# Patient Record
Sex: Male | Born: 1940 | Race: White | Hispanic: No | Marital: Married | Smoking: Former smoker
Health system: Southern US, Community
[De-identification: ages and names within clinical notes are randomized; demographics above are authoritative.]

## PROBLEM LIST (undated history)

## (undated) DIAGNOSIS — I10 Essential (primary) hypertension: Secondary | ICD-10-CM

## (undated) DIAGNOSIS — I4891 Unspecified atrial fibrillation: Secondary | ICD-10-CM

## (undated) DIAGNOSIS — M1990 Unspecified osteoarthritis, unspecified site: Secondary | ICD-10-CM

## (undated) DIAGNOSIS — Z8719 Personal history of other diseases of the digestive system: Secondary | ICD-10-CM

## (undated) DIAGNOSIS — K219 Gastro-esophageal reflux disease without esophagitis: Secondary | ICD-10-CM

## (undated) DIAGNOSIS — J449 Chronic obstructive pulmonary disease, unspecified: Secondary | ICD-10-CM

## (undated) DIAGNOSIS — G4730 Sleep apnea, unspecified: Secondary | ICD-10-CM

## (undated) DIAGNOSIS — K900 Celiac disease: Secondary | ICD-10-CM

## (undated) DIAGNOSIS — I350 Nonrheumatic aortic (valve) stenosis: Secondary | ICD-10-CM

## (undated) DIAGNOSIS — K2270 Barrett's esophagus without dysplasia: Secondary | ICD-10-CM

## (undated) DIAGNOSIS — I719 Aortic aneurysm of unspecified site, without rupture: Secondary | ICD-10-CM

## (undated) DIAGNOSIS — R011 Cardiac murmur, unspecified: Secondary | ICD-10-CM

## (undated) DIAGNOSIS — R112 Nausea with vomiting, unspecified: Secondary | ICD-10-CM

## (undated) DIAGNOSIS — Z9889 Other specified postprocedural states: Secondary | ICD-10-CM

## (undated) DIAGNOSIS — R42 Dizziness and giddiness: Secondary | ICD-10-CM

## (undated) HISTORY — PX: CARDIAC CATHETERIZATION: SHX172

## (undated) HISTORY — PX: TUMOR EXCISION: SHX421

## (undated) HISTORY — PX: COLONOSCOPY: SHX174

## (undated) HISTORY — PX: CARDIOVERSION: SHX1299

## (undated) HISTORY — PX: CARDIAC ELECTROPHYSIOLOGY STUDY AND ABLATION: SHX1294

---

## 2004-07-09 ENCOUNTER — Ambulatory Visit: Payer: Self-pay | Admitting: Gastroenterology

## 2005-11-15 ENCOUNTER — Ambulatory Visit: Payer: Self-pay

## 2006-09-03 ENCOUNTER — Ambulatory Visit: Payer: Self-pay | Admitting: Internal Medicine

## 2006-09-04 ENCOUNTER — Ambulatory Visit: Payer: Self-pay | Admitting: Internal Medicine

## 2006-11-12 ENCOUNTER — Ambulatory Visit: Payer: Self-pay | Admitting: Otolaryngology

## 2009-01-19 ENCOUNTER — Ambulatory Visit: Payer: Self-pay | Admitting: Internal Medicine

## 2009-02-13 ENCOUNTER — Ambulatory Visit: Payer: Self-pay | Admitting: Internal Medicine

## 2009-05-10 ENCOUNTER — Ambulatory Visit: Payer: Self-pay | Admitting: Internal Medicine

## 2011-05-08 ENCOUNTER — Ambulatory Visit: Payer: Self-pay | Admitting: Family Medicine

## 2011-12-05 DIAGNOSIS — R911 Solitary pulmonary nodule: Secondary | ICD-10-CM | POA: Insufficient documentation

## 2012-04-09 DIAGNOSIS — I480 Paroxysmal atrial fibrillation: Secondary | ICD-10-CM | POA: Insufficient documentation

## 2012-04-09 DIAGNOSIS — I10 Essential (primary) hypertension: Secondary | ICD-10-CM | POA: Insufficient documentation

## 2012-04-09 DIAGNOSIS — E785 Hyperlipidemia, unspecified: Secondary | ICD-10-CM | POA: Insufficient documentation

## 2012-11-23 DIAGNOSIS — M545 Low back pain: Secondary | ICD-10-CM | POA: Insufficient documentation

## 2012-11-23 DIAGNOSIS — M5136 Other intervertebral disc degeneration, lumbar region: Secondary | ICD-10-CM | POA: Insufficient documentation

## 2012-12-01 DIAGNOSIS — M47816 Spondylosis without myelopathy or radiculopathy, lumbar region: Secondary | ICD-10-CM | POA: Insufficient documentation

## 2012-12-28 DIAGNOSIS — I70219 Atherosclerosis of native arteries of extremities with intermittent claudication, unspecified extremity: Secondary | ICD-10-CM | POA: Insufficient documentation

## 2013-02-02 DIAGNOSIS — M7072 Other bursitis of hip, left hip: Secondary | ICD-10-CM | POA: Insufficient documentation

## 2013-09-22 DIAGNOSIS — I350 Nonrheumatic aortic (valve) stenosis: Secondary | ICD-10-CM | POA: Insufficient documentation

## 2014-01-26 ENCOUNTER — Emergency Department: Payer: Self-pay | Admitting: Emergency Medicine

## 2014-01-26 LAB — BASIC METABOLIC PANEL
Anion Gap: 3 — ABNORMAL LOW (ref 7–16)
BUN: 16 mg/dL (ref 7–18)
CALCIUM: 8.5 mg/dL (ref 8.5–10.1)
Chloride: 107 mmol/L (ref 98–107)
Co2: 29 mmol/L (ref 21–32)
Creatinine: 0.98 mg/dL (ref 0.60–1.30)
EGFR (African American): >60
GLUCOSE: 110 mg/dL — AB (ref 65–99)
Osmolality: 279 (ref 275–301)
Potassium: 4 mmol/L (ref 3.5–5.1)
Sodium: 139 mmol/L (ref 136–145)

## 2014-01-26 LAB — CBC
HCT: 38.1 % — AB (ref 40.0–52.0)
HGB: 12.9 g/dL — AB (ref 13.0–18.0)
MCH: 28.9 pg (ref 26.0–34.0)
MCHC: 33.8 g/dL (ref 32.0–36.0)
MCV: 85 fL (ref 80–100)
PLATELETS: 173 10*3/uL (ref 150–440)
RBC: 4.47 10*6/uL (ref 4.40–5.90)
RDW: 14.1 % (ref 11.5–14.5)
WBC: 7.2 10*3/uL (ref 3.8–10.6)

## 2014-01-26 LAB — TROPONIN I
Troponin-I: <0.02 ng/mL
Troponin-I: <0.02 ng/mL

## 2014-01-26 LAB — APTT: ACTIVATED PTT: 28.6 s (ref 23.6–35.9)

## 2014-01-26 LAB — PRO B NATRIURETIC PEPTIDE: B-TYPE NATIURETIC PEPTID: 158 pg/mL — AB (ref 0–125)

## 2014-01-26 LAB — CK TOTAL AND CKMB (NOT AT ARMC)
CK, Total: 181 U/L
CK-MB: 2.3 ng/mL (ref 0.5–3.6)

## 2014-01-26 LAB — PROTIME-INR
INR: 1.1
PROTHROMBIN TIME: 14.2 s (ref 11.5–14.7)

## 2014-09-14 DIAGNOSIS — M5117 Intervertebral disc disorders with radiculopathy, lumbosacral region: Secondary | ICD-10-CM | POA: Insufficient documentation

## 2014-11-10 DIAGNOSIS — M4807 Spinal stenosis, lumbosacral region: Secondary | ICD-10-CM | POA: Insufficient documentation

## 2014-12-09 DIAGNOSIS — J439 Emphysema, unspecified: Secondary | ICD-10-CM | POA: Insufficient documentation

## 2014-12-22 DIAGNOSIS — M25552 Pain in left hip: Secondary | ICD-10-CM | POA: Insufficient documentation

## 2014-12-22 DIAGNOSIS — M47816 Spondylosis without myelopathy or radiculopathy, lumbar region: Secondary | ICD-10-CM | POA: Insufficient documentation

## 2015-02-25 DIAGNOSIS — M67952 Unspecified disorder of synovium and tendon, left thigh: Secondary | ICD-10-CM | POA: Insufficient documentation

## 2015-04-06 DIAGNOSIS — M2170 Unequal limb length (acquired), unspecified site: Secondary | ICD-10-CM | POA: Insufficient documentation

## 2016-03-05 ENCOUNTER — Encounter: Payer: Self-pay | Admitting: *Deleted

## 2016-03-11 NOTE — Discharge Instructions (Signed)

## 2016-03-13 ENCOUNTER — Ambulatory Visit: Payer: Medicare Other | Admitting: Anesthesiology

## 2016-03-13 ENCOUNTER — Encounter
Admission: RE | Disposition: A | Discharge status: Home / Self Care | Payer: Self-pay | Source: Ambulatory Visit | Attending: Ophthalmology

## 2016-03-13 ENCOUNTER — Ambulatory Visit
Admission: RE | Admit: 2016-03-13 | Discharge: 2016-03-13 | Disposition: A | Discharge status: Home / Self Care | Payer: Medicare Other | Source: Ambulatory Visit | Attending: Ophthalmology | Admitting: Ophthalmology

## 2016-03-13 DIAGNOSIS — M1990 Unspecified osteoarthritis, unspecified site: Secondary | ICD-10-CM | POA: Diagnosis not present

## 2016-03-13 DIAGNOSIS — G4730 Sleep apnea, unspecified: Secondary | ICD-10-CM | POA: Insufficient documentation

## 2016-03-13 DIAGNOSIS — H2511 Age-related nuclear cataract, right eye: Secondary | ICD-10-CM | POA: Diagnosis not present

## 2016-03-13 DIAGNOSIS — Z87891 Personal history of nicotine dependence: Secondary | ICD-10-CM | POA: Diagnosis not present

## 2016-03-13 DIAGNOSIS — I10 Essential (primary) hypertension: Secondary | ICD-10-CM | POA: Insufficient documentation

## 2016-03-13 DIAGNOSIS — J449 Chronic obstructive pulmonary disease, unspecified: Secondary | ICD-10-CM | POA: Diagnosis not present

## 2016-03-13 DIAGNOSIS — K219 Gastro-esophageal reflux disease without esophagitis: Secondary | ICD-10-CM | POA: Diagnosis not present

## 2016-03-13 DIAGNOSIS — I739 Peripheral vascular disease, unspecified: Secondary | ICD-10-CM | POA: Diagnosis not present

## 2016-03-13 DIAGNOSIS — K449 Diaphragmatic hernia without obstruction or gangrene: Secondary | ICD-10-CM | POA: Diagnosis not present

## 2016-03-13 HISTORY — DX: Unspecified atrial fibrillation: I48.91

## 2016-03-13 HISTORY — DX: Unspecified osteoarthritis, unspecified site: M19.90

## 2016-03-13 HISTORY — DX: Other specified postprocedural states: Z98.890

## 2016-03-13 HISTORY — DX: Nausea with vomiting, unspecified: R11.2

## 2016-03-13 HISTORY — PX: CATARACT EXTRACTION W/PHACO: SHX586

## 2016-03-13 HISTORY — DX: Gastro-esophageal reflux disease without esophagitis: K21.9

## 2016-03-13 HISTORY — DX: Personal history of other diseases of the digestive system: Z87.19

## 2016-03-13 HISTORY — DX: Chronic obstructive pulmonary disease, unspecified: J44.9

## 2016-03-13 HISTORY — DX: Sleep apnea, unspecified: G47.30

## 2016-03-13 HISTORY — DX: Celiac disease: K90.0

## 2016-03-13 HISTORY — DX: Essential (primary) hypertension: I10

## 2016-03-13 HISTORY — DX: Cardiac murmur, unspecified: R01.1

## 2016-03-13 HISTORY — DX: Barrett's esophagus without dysplasia: K22.70

## 2016-03-13 HISTORY — DX: Nonrheumatic aortic (valve) stenosis: I35.0

## 2016-03-13 HISTORY — DX: Aortic aneurysm of unspecified site, without rupture: I71.9

## 2016-03-13 HISTORY — DX: Dizziness and giddiness: R42

## 2016-03-13 SURGERY — PHACOEMULSIFICATION, CATARACT, WITH IOL INSERTION
Anesthesia: Monitor Anesthesia Care | Laterality: Right | Wound class: Clean

## 2016-03-13 MED ORDER — TETRACAINE HCL 0.5 % OP SOLN
1.0000 [drp] | OPHTHALMIC | Status: DC | PRN
  Administered 2016-03-13: 1 [drp] via OPHTHALMIC

## 2016-03-13 MED ORDER — BRIMONIDINE TARTRATE 0.2 % OP SOLN
OPHTHALMIC | Status: DC | PRN
  Administered 2016-03-13: 1 [drp] via OPHTHALMIC

## 2016-03-13 MED ORDER — EPINEPHRINE HCL 1 MG/ML IJ SOLN
INTRAOCULAR | Status: DC | PRN
  Administered 2016-03-13: 74 mL via OPHTHALMIC

## 2016-03-13 MED ORDER — FENTANYL CITRATE (PF) 100 MCG/2ML IJ SOLN
INTRAMUSCULAR | Status: DC | PRN
  Administered 2016-03-13: 50 ug via INTRAVENOUS

## 2016-03-13 MED ORDER — TIMOLOL MALEATE 0.5 % OP SOLN
OPHTHALMIC | Status: DC | PRN
  Administered 2016-03-13: 1 [drp] via OPHTHALMIC

## 2016-03-13 MED ORDER — POVIDONE-IODINE 5 % OP SOLN
1.0000 "application " | OPHTHALMIC | Status: DC | PRN
  Administered 2016-03-13: 1 via OPHTHALMIC

## 2016-03-13 MED ORDER — NA HYALUR & NA CHOND-NA HYALUR 0.4-0.35 ML IO KIT
PACK | INTRAOCULAR | Status: DC | PRN
  Administered 2016-03-13: 1 mL via INTRAOCULAR

## 2016-03-13 MED ORDER — CEFUROXIME OPHTHALMIC INJECTION 1 MG/0.1 ML
INJECTION | OPHTHALMIC | Status: DC | PRN
  Administered 2016-03-13: 0.1 mL via OPHTHALMIC

## 2016-03-13 MED ORDER — ARMC OPHTHALMIC DILATING GEL
1.0000 "application " | OPHTHALMIC | Status: DC | PRN
  Administered 2016-03-13 (×2): 1 via OPHTHALMIC

## 2016-03-13 MED ORDER — MIDAZOLAM HCL 2 MG/2ML IJ SOLN
INTRAMUSCULAR | Status: DC | PRN
  Administered 2016-03-13: 2 mg via INTRAVENOUS

## 2016-03-13 MED ORDER — LIDOCAINE HCL (PF) 4 % IJ SOLN
INTRAMUSCULAR | Status: DC | PRN
  Administered 2016-03-13: 1 mL via OPHTHALMIC

## 2016-03-13 SURGICAL SUPPLY — 25 items
CANNULA ANT/CHMB 27GA (MISCELLANEOUS) ×3 IMPLANT
CARTRIDGE ABBOTT (MISCELLANEOUS) IMPLANT
GLOVE SURG LX 7.5 STRW (GLOVE) ×2
GLOVE SURG LX STRL 7.5 STRW (GLOVE) ×1 IMPLANT
GLOVE SURG TRIUMPH 8.0 PF LTX (GLOVE) ×3 IMPLANT
GOWN STRL REUS W/ TWL LRG LVL3 (GOWN DISPOSABLE) ×2 IMPLANT
GOWN STRL REUS W/TWL LRG LVL3 (GOWN DISPOSABLE) ×4
LENS IOL TECNIS ITEC 20.5 (Intraocular Lens) ×3 IMPLANT
MARKER SKIN DUAL TIP RULER LAB (MISCELLANEOUS) ×3 IMPLANT
NDL RETROBULBAR .5 NSTRL (NEEDLE) IMPLANT
NEEDLE FILTER BLUNT 18X 1/2SAF (NEEDLE) ×2
NEEDLE FILTER BLUNT 18X1 1/2 (NEEDLE) ×1 IMPLANT
PACK CATARACT BRASINGTON (MISCELLANEOUS) ×3 IMPLANT
PACK EYE AFTER SURG (MISCELLANEOUS) ×3 IMPLANT
PACK OPTHALMIC (MISCELLANEOUS) ×3 IMPLANT
RING MALYGIN 7.0 (MISCELLANEOUS) IMPLANT
SUT ETHILON 10-0 CS-B-6CS-B-6 (SUTURE)
SUT VICRYL  9 0 (SUTURE)
SUT VICRYL 9 0 (SUTURE) IMPLANT
SUTURE EHLN 10-0 CS-B-6CS-B-6 (SUTURE) IMPLANT
SYR 3ML LL SCALE MARK (SYRINGE) ×3 IMPLANT
SYR 5ML LL (SYRINGE) ×3 IMPLANT
SYR TB 1ML LUER SLIP (SYRINGE) ×3 IMPLANT
WATER STERILE IRR 250ML POUR (IV SOLUTION) ×3 IMPLANT
WIPE NON LINTING 3.25X3.25 (MISCELLANEOUS) ×3 IMPLANT

## 2016-03-13 NOTE — Anesthesia Preprocedure Evaluation (Addendum)
Anesthesia Evaluation  Patient identified by MRN, date of birth, ID band  Reviewed: Allergy & Precautions, NPO status , Patient's Chart, lab work & pertinent test results  History of Anesthesia Complications (+) PONV and history of anesthetic complications  Airway Mallampati: II  TM Distance: >3 FB Neck ROM: Full    Dental no notable dental hx.    Pulmonary sleep apnea , COPD, former smoker,    Pulmonary exam normal        Cardiovascular hypertension, Pt. on medications and Pt. on home beta blockers + Peripheral Vascular Disease  Normal cardiovascular exam+ Valvular Problems/Murmurs AS      Neuro/Psych negative neurological ROS  negative psych ROS   GI/Hepatic Neg liver ROS, hiatal hernia, GERD  Medicated and Controlled,  Endo/Other  negative endocrine ROS  Renal/GU negative Renal ROS     Musculoskeletal  (+) Arthritis , Osteoarthritis,    Abdominal   Peds  Hematology   Anesthesia Other Findings   Reproductive/Obstetrics                             Anesthesia Physical  Anesthesia Plan  ASA: III  Anesthesia Plan: MAC   Post-op Pain Management:    Induction: Intravenous  Airway Management Planned:   Additional Equipment:   Intra-op Plan:   Post-operative Plan:   Informed Consent: I have reviewed the patients History and Physical, chart, labs and discussed the procedure including the risks, benefits and alternatives for the proposed anesthesia with the patient or authorized representative who has indicated his/her understanding and acceptance.     Plan Discussed with: CRNA  Anesthesia Plan Comments:         Anesthesia Quick Evaluation  

## 2016-03-13 NOTE — Op Note (Signed)
LOCATION:  [facility]   PREOPERATIVE DIAGNOSIS:    Nuclear sclerotic cataract right eye. H25.11   POSTOPERATIVE DIAGNOSIS:  Nuclear sclerotic cataract right eye.     PROCEDURE:  Phacoemusification with posterior chamber intraocular lens placement of the right eye   LENS:   Implant Name Type Inv. Item Serial No. Manufacturer Lot No. LRB No. Used  LENS IOL DIOP 20.5 - [id number] Intraocular Lens LENS IOL DIOP 20.5 [id number]   Right 1        ULTRASOUND TIME: 10 % of 1 minutes, 17 seconds.  CDE 7.9   SURGEON:  Deirdre Evenerhadwick R. Lockie Bothun, MD   ANESTHESIA:  Topical with tetracaine drops and 2% Xylocaine jelly, augmented with 1% preservative-free intracameral lidocaine.    COMPLICATIONS:  None.   DESCRIPTION OF PROCEDURE:  The patient was identified in the holding room and transported to the operating room and placed in the supine position under the operating microscope.  The right eye was identified as the operative eye and it was prepped and draped in the usual sterile ophthalmic fashion.   A 1 millimeter clear-corneal paracentesis was made at the 12:00 position.  0.5 ml of preservative-free 1% lidocaine was injected into the anterior chamber. The anterior chamber was filled with Viscoat viscoelastic.  A 2.4 millimeter keratome was used to make a near-clear corneal incision at the 9:00 position.  A curvilinear capsulorrhexis was made with a cystotome and capsulorrhexis forceps.  Balanced salt solution was used to hydrodissect and hydrodelineate the nucleus.   Phacoemulsification was then used in stop and chop fashion to remove the lens nucleus and epinucleus.  The remaining cortex was then removed using the irrigation and aspiration handpiece. Provisc was then placed into the capsular bag to distend it for lens placement.  A lens was then injected into the capsular bag.  The remaining viscoelastic was aspirated.   Wounds were hydrated with balanced salt solution.  The anterior  chamber was inflated to a physiologic pressure with balanced salt solution.  No wound leaks were noted. Cefuroxime 0.1 ml of a 10mg /ml solution was injected into the anterior chamber for a dose of 1 mg of intracameral antibiotic at the completion of the case.   Timolol and Brimonidine drops were applied to the eye.  The patient was taken to the recovery room in stable condition without complications of anesthesia or surgery.   Sharisse Rantz 03/13/2016, 10:45 AM

## 2016-03-13 NOTE — H&P (Signed)
  The History and Physical notes are on paper, have been signed, and are to be scanned. The patient remains stable and unchanged from the H&P.   Previous H&P reviewed, patient examined, and there are no changes.  Jeremy Mason 03/13/2016 9:26 AM

## 2016-03-13 NOTE — Anesthesia Postprocedure Evaluation (Signed)
Anesthesia Post Note  Patient: Jeremy Mason  Procedure(s) Performed: Procedure(s) (LRB): CATARACT EXTRACTION PHACO AND INTRAOCULAR LENS PLACEMENT (IOC) (Right)  Patient location during evaluation: PACU Anesthesia Type: MAC Level of consciousness: awake and alert and oriented Pain management: pain level controlled Vital Signs Assessment: post-procedure vital signs reviewed and stable Respiratory status: spontaneous breathing and nonlabored ventilation Cardiovascular status: stable Postop Assessment: no signs of nausea or vomiting and adequate PO intake Anesthetic complications: no    Harolyn RutherfordJoshua Vidya Bamford

## 2016-03-13 NOTE — Anesthesia Procedure Notes (Signed)
Procedure Name: MAC Performed by: Jeremy Mason Pre-anesthesia Checklist: Patient identified, Emergency Drugs available, Suction available, Timeout performed and Patient being monitored Patient Re-evaluated:Patient Re-evaluated prior to inductionOxygen Delivery Method: Nasal cannula Placement Confirmation: positive ETCO2       

## 2016-03-13 NOTE — Transfer of Care (Signed)
Immediate Anesthesia Transfer of Care Note  Patient: Jeremy Mason  Procedure(s) Performed: Procedure(s) with comments: CATARACT EXTRACTION PHACO AND INTRAOCULAR LENS PLACEMENT (IOC) (Right) - sleep apnea - no CPAP  Patient Location: PACU  Anesthesia Type: MAC  Level of Consciousness: awake, alert  and patient cooperative  Airway and Oxygen Therapy: Patient Spontanous Breathing and Patient connected to supplemental oxygen  Post-op Assessment: Post-op Vital signs reviewed, Patient's Cardiovascular Status Stable, Respiratory Function Stable, Patent Airway and No signs of Nausea or vomiting  Post-op Vital Signs: Reviewed and stable  Complications: No apparent anesthesia complications

## 2016-03-14 ENCOUNTER — Encounter: Payer: Self-pay | Admitting: Ophthalmology

## 2016-04-04 ENCOUNTER — Encounter: Payer: Self-pay | Admitting: *Deleted

## 2016-04-05 NOTE — Discharge Instructions (Signed)

## 2016-04-10 ENCOUNTER — Ambulatory Visit
Admission: RE | Admit: 2016-04-10 | Discharge: 2016-04-10 | Disposition: A | Discharge status: Home / Self Care | Payer: Medicare Other | Source: Ambulatory Visit | Attending: Ophthalmology | Admitting: Ophthalmology

## 2016-04-10 ENCOUNTER — Encounter
Admission: RE | Disposition: A | Discharge status: Home / Self Care | Payer: Self-pay | Source: Ambulatory Visit | Attending: Ophthalmology

## 2016-04-10 ENCOUNTER — Ambulatory Visit: Payer: Medicare Other | Admitting: Student in an Organized Health Care Education/Training Program

## 2016-04-10 DIAGNOSIS — K219 Gastro-esophageal reflux disease without esophagitis: Secondary | ICD-10-CM | POA: Diagnosis not present

## 2016-04-10 DIAGNOSIS — H2512 Age-related nuclear cataract, left eye: Secondary | ICD-10-CM | POA: Diagnosis not present

## 2016-04-10 DIAGNOSIS — I739 Peripheral vascular disease, unspecified: Secondary | ICD-10-CM | POA: Diagnosis not present

## 2016-04-10 DIAGNOSIS — K449 Diaphragmatic hernia without obstruction or gangrene: Secondary | ICD-10-CM | POA: Diagnosis not present

## 2016-04-10 DIAGNOSIS — J449 Chronic obstructive pulmonary disease, unspecified: Secondary | ICD-10-CM | POA: Diagnosis not present

## 2016-04-10 DIAGNOSIS — Z87891 Personal history of nicotine dependence: Secondary | ICD-10-CM | POA: Insufficient documentation

## 2016-04-10 DIAGNOSIS — M1990 Unspecified osteoarthritis, unspecified site: Secondary | ICD-10-CM | POA: Insufficient documentation

## 2016-04-10 DIAGNOSIS — G4730 Sleep apnea, unspecified: Secondary | ICD-10-CM | POA: Insufficient documentation

## 2016-04-10 DIAGNOSIS — I10 Essential (primary) hypertension: Secondary | ICD-10-CM | POA: Diagnosis not present

## 2016-04-10 HISTORY — PX: CATARACT EXTRACTION W/PHACO: SHX586

## 2016-04-10 SURGERY — PHACOEMULSIFICATION, CATARACT, WITH IOL INSERTION
Anesthesia: Monitor Anesthesia Care | Site: Eye | Laterality: Left | Wound class: Clean

## 2016-04-10 MED ORDER — TIMOLOL MALEATE 0.5 % OP SOLN
OPHTHALMIC | Status: DC | PRN
  Administered 2016-04-10: 1 [drp] via OPHTHALMIC

## 2016-04-10 MED ORDER — ARMC OPHTHALMIC DILATING GEL
1.0000 "application " | OPHTHALMIC | Status: DC | PRN
  Administered 2016-04-10 (×2): 1 via OPHTHALMIC

## 2016-04-10 MED ORDER — MIDAZOLAM HCL 2 MG/2ML IJ SOLN
INTRAMUSCULAR | Status: DC | PRN
  Administered 2016-04-10: 2 mg via INTRAVENOUS

## 2016-04-10 MED ORDER — LACTATED RINGERS IV SOLN: 500.0000 mL | INTRAVENOUS | Status: DC

## 2016-04-10 MED ORDER — ONDANSETRON HCL 4 MG/2ML IJ SOLN
INTRAMUSCULAR | Status: DC | PRN
  Administered 2016-04-10: 4 mg via INTRAVENOUS

## 2016-04-10 MED ORDER — TETRACAINE HCL 0.5 % OP SOLN
1.0000 [drp] | OPHTHALMIC | Status: DC | PRN
  Administered 2016-04-10: 1 [drp] via OPHTHALMIC

## 2016-04-10 MED ORDER — CEFUROXIME OPHTHALMIC INJECTION 1 MG/0.1 ML
INJECTION | OPHTHALMIC | Status: DC | PRN
  Administered 2016-04-10: 0.1 mL via INTRACAMERAL

## 2016-04-10 MED ORDER — EPINEPHRINE HCL 1 MG/ML IJ SOLN
INTRAOCULAR | Status: DC | PRN
  Administered 2016-04-10: 65 mL via OPHTHALMIC

## 2016-04-10 MED ORDER — BRIMONIDINE TARTRATE 0.2 % OP SOLN
OPHTHALMIC | Status: DC | PRN
  Administered 2016-04-10: 1 [drp] via OPHTHALMIC

## 2016-04-10 MED ORDER — NA HYALUR & NA CHOND-NA HYALUR 0.4-0.35 ML IO KIT
PACK | INTRAOCULAR | Status: DC | PRN
  Administered 2016-04-10: 1 mL via INTRAOCULAR

## 2016-04-10 MED ORDER — LIDOCAINE HCL (PF) 4 % IJ SOLN
INTRAOCULAR | Status: DC | PRN
  Administered 2016-04-10: 1 mL via OPHTHALMIC

## 2016-04-10 MED ORDER — ACETAMINOPHEN 325 MG PO TABS: 325.0000 mg | ORAL_TABLET | ORAL | Status: DC | PRN

## 2016-04-10 MED ORDER — ACETAMINOPHEN 160 MG/5ML PO SOLN: 325.0000 mg | ORAL | Status: DC | PRN

## 2016-04-10 MED ORDER — LACTATED RINGERS IV SOLN: INTRAVENOUS | Status: DC

## 2016-04-10 MED ORDER — POVIDONE-IODINE 5 % OP SOLN
1.0000 "application " | OPHTHALMIC | Status: DC | PRN
  Administered 2016-04-10: 1 via OPHTHALMIC

## 2016-04-10 SURGICAL SUPPLY — 25 items
CANNULA ANT/CHMB 27GA (MISCELLANEOUS) IMPLANT
CARTRIDGE ABBOTT (MISCELLANEOUS) IMPLANT
GLOVE SURG LX 7.5 STRW (GLOVE) ×2
GLOVE SURG LX STRL 7.5 STRW (GLOVE) ×1 IMPLANT
GLOVE SURG TRIUMPH 8.0 PF LTX (GLOVE) ×3 IMPLANT
GOWN STRL REUS W/ TWL LRG LVL3 (GOWN DISPOSABLE) ×2 IMPLANT
GOWN STRL REUS W/TWL LRG LVL3 (GOWN DISPOSABLE) ×4
LENS IOL TECNIS ITEC 21.0 (Intraocular Lens) ×3 IMPLANT
MARKER SKIN DUAL TIP RULER LAB (MISCELLANEOUS) ×3 IMPLANT
NDL RETROBULBAR .5 NSTRL (NEEDLE) IMPLANT
NEEDLE FILTER BLUNT 18X 1/2SAF (NEEDLE) ×2
NEEDLE FILTER BLUNT 18X1 1/2 (NEEDLE) ×1 IMPLANT
PACK CATARACT BRASINGTON (MISCELLANEOUS) ×3 IMPLANT
PACK EYE AFTER SURG (MISCELLANEOUS) ×3 IMPLANT
PACK OPTHALMIC (MISCELLANEOUS) ×3 IMPLANT
RING MALYGIN 7.0 (MISCELLANEOUS) IMPLANT
SUT ETHILON 10-0 CS-B-6CS-B-6 (SUTURE)
SUT VICRYL  9 0 (SUTURE)
SUT VICRYL 9 0 (SUTURE) IMPLANT
SUTURE EHLN 10-0 CS-B-6CS-B-6 (SUTURE) IMPLANT
SYR 3ML LL SCALE MARK (SYRINGE) ×3 IMPLANT
SYR 5ML LL (SYRINGE) ×3 IMPLANT
SYR TB 1ML LUER SLIP (SYRINGE) ×3 IMPLANT
WATER STERILE IRR 250ML POUR (IV SOLUTION) ×3 IMPLANT
WIPE NON LINTING 3.25X3.25 (MISCELLANEOUS) ×3 IMPLANT

## 2016-04-10 NOTE — Anesthesia Procedure Notes (Signed)
Procedure Name: MAC Performed by: Messiyah Waterson Pre-anesthesia Checklist: Patient identified, Emergency Drugs available, Suction available, Timeout performed and Patient being monitored Patient Re-evaluated:Patient Re-evaluated prior to inductionOxygen Delivery Method: Nasal cannula Placement Confirmation: positive ETCO2       

## 2016-04-10 NOTE — Anesthesia Postprocedure Evaluation (Signed)
Anesthesia Post Note  Patient: Jeremy Mason  Procedure(s) Performed: Procedure(s) (LRB): CATARACT EXTRACTION PHACO AND INTRAOCULAR LENS PLACEMENT (IOC) left eye (Left)  Patient location during evaluation: PACU Anesthesia Type: MAC Level of consciousness: awake and alert Pain management: pain level controlled Vital Signs Assessment: post-procedure vital signs reviewed and stable Respiratory status: spontaneous breathing, nonlabored ventilation and respiratory function stable Cardiovascular status: blood pressure returned to baseline and stable Postop Assessment: no signs of nausea or vomiting Anesthetic complications: no    DANIEL D KOVACS

## 2016-04-10 NOTE — Anesthesia Preprocedure Evaluation (Signed)
Anesthesia Evaluation  Patient identified by MRN, date of birth, ID band  Reviewed: Allergy & Precautions, NPO status , Patient's Chart, lab work & pertinent test results  History of Anesthesia Complications (+) PONV and history of anesthetic complications  Airway Mallampati: II  TM Distance: >3 FB Neck ROM: Full    Dental no notable dental hx.    Pulmonary sleep apnea , COPD, former smoker,    Pulmonary exam normal        Cardiovascular hypertension, Pt. on medications and Pt. on home beta blockers + Peripheral Vascular Disease  Normal cardiovascular exam+ Valvular Problems/Murmurs AS      Neuro/Psych negative neurological ROS  negative psych ROS   GI/Hepatic Neg liver ROS, hiatal hernia, GERD  Medicated and Controlled,  Endo/Other  negative endocrine ROS  Renal/GU negative Renal ROS     Musculoskeletal  (+) Arthritis , Osteoarthritis,    Abdominal   Peds  Hematology   Anesthesia Other Findings   Reproductive/Obstetrics                             Anesthesia Physical  Anesthesia Plan  ASA: III  Anesthesia Plan: MAC   Post-op Pain Management:    Induction: Intravenous  Airway Management Planned:   Additional Equipment:   Intra-op Plan:   Post-operative Plan:   Informed Consent: I have reviewed the patients History and Physical, chart, labs and discussed the procedure including the risks, benefits and alternatives for the proposed anesthesia with the patient or authorized representative who has indicated his/her understanding and acceptance.     Plan Discussed with: CRNA  Anesthesia Plan Comments:         Anesthesia Quick Evaluation

## 2016-04-10 NOTE — Op Note (Signed)
OPERATIVE NOTE  Jeremy Mason [id number] 04/10/2016   PREOPERATIVE DIAGNOSIS:  Nuclear sclerotic cataract left eye. H25.12   POSTOPERATIVE DIAGNOSIS:    Nuclear sclerotic cataract left eye.     PROCEDURE:  Phacoemusification with posterior chamber intraocular lens placement of the left eye   LENS:   Implant Name Type Inv. Item Serial No. Manufacturer Lot No. LRB No. Used  LENS IOL DIOP 21.0 - [id number] Intraocular Lens LENS IOL DIOP 21.0 [id number]   Left 1        ULTRASOUND TIME: 13  % of 1 minutes 9 seconds, CDE 9.1  SURGEON:  Deirdre Evenerhadwick R. Breeley Bischof, MD   ANESTHESIA:  Topical with tetracaine drops and 2% Xylocaine jelly, augmented with 1% preservative-free intracameral lidocaine.    COMPLICATIONS:  None.   DESCRIPTION OF PROCEDURE:  The patient was identified in the holding room and transported to the operating room and placed in the supine position under the operating microscope.  The left eye was identified as the operative eye and it was prepped and draped in the usual sterile ophthalmic fashion.   A 1 millimeter clear-corneal paracentesis was made at the 1:30 position.  0.5 ml of preservative-free 1% lidocaine was injected into the anterior chamber.  The anterior chamber was filled with Viscoat viscoelastic.  A 2.4 millimeter keratome was used to make a near-clear corneal incision at the 10:30 position.  .  A curvilinear capsulorrhexis was made with a cystotome and capsulorrhexis forceps.  Balanced salt solution was used to hydrodissect and hydrodelineate the nucleus.   Phacoemulsification was then used in stop and chop fashion to remove the lens nucleus and epinucleus.  The remaining cortex was then removed using the irrigation and aspiration handpiece. Provisc was then placed into the capsular bag to distend it for lens placement.  A lens was then injected into the capsular bag.  The remaining viscoelastic was aspirated.   Wounds were hydrated with balanced salt  solution.  The anterior chamber was inflated to a physiologic pressure with balanced salt solution.  No wound leaks were noted. Cefuroxime 0.1 ml of a 10mg /ml solution was injected into the anterior chamber for a dose of 1 mg of intracameral antibiotic at the completion of the case.   Timolol and Brimonidine drops were applied to the eye.  The patient was taken to the recovery room in stable condition without complications of anesthesia or surgery.  Jeremy Mason 04/10/2016, 11:10 AM

## 2016-04-10 NOTE — Transfer of Care (Signed)
Immediate Anesthesia Transfer of Care Note  Patient: Jeremy Mason  Procedure(s) Performed: Procedure(s): CATARACT EXTRACTION PHACO AND INTRAOCULAR LENS PLACEMENT (IOC) left eye (Left)  Patient Location: PACU  Anesthesia Type: MAC  Level of Consciousness: awake, alert  and patient cooperative  Airway and Oxygen Therapy: Patient Spontanous Breathing and Patient connected to supplemental oxygen  Post-op Assessment: Post-op Vital signs reviewed, Patient's Cardiovascular Status Stable, Respiratory Function Stable, Patent Airway and No signs of Nausea or vomiting  Post-op Vital Signs: Reviewed and stable  Complications: No apparent anesthesia complications

## 2016-04-10 NOTE — H&P (Signed)
  The History and Physical notes are on paper, have been signed, and are to be scanned. The patient remains stable and unchanged from the H&P.   Previous H&P reviewed, patient examined, and there are no changes.  Jeremy Mason 04/10/2016 10:22 AM

## 2016-04-11 ENCOUNTER — Encounter: Payer: Self-pay | Admitting: Ophthalmology

## 2016-08-29 DIAGNOSIS — E872 Acidosis, unspecified: Secondary | ICD-10-CM | POA: Insufficient documentation

## 2016-08-29 DIAGNOSIS — Z953 Presence of xenogenic heart valve: Secondary | ICD-10-CM | POA: Insufficient documentation

## 2016-08-29 DIAGNOSIS — Z45018 Encounter for adjustment and management of other part of cardiac pacemaker: Secondary | ICD-10-CM | POA: Insufficient documentation

## 2016-09-09 DIAGNOSIS — R002 Palpitations: Secondary | ICD-10-CM | POA: Insufficient documentation

## 2016-09-25 DIAGNOSIS — Z952 Presence of prosthetic heart valve: Secondary | ICD-10-CM | POA: Insufficient documentation

## 2016-10-08 ENCOUNTER — Other Ambulatory Visit: Payer: Self-pay | Admitting: Otolaryngology

## 2016-10-08 DIAGNOSIS — R42 Dizziness and giddiness: Secondary | ICD-10-CM

## 2016-10-18 ENCOUNTER — Ambulatory Visit: Payer: Medicare Other

## 2016-10-28 ENCOUNTER — Ambulatory Visit
Admission: RE | Admit: 2016-10-28 | Discharge: 2016-10-28 | Disposition: A | Discharge status: Home / Self Care | Payer: Medicare Other | Source: Ambulatory Visit | Attending: Otolaryngology | Admitting: Otolaryngology

## 2016-10-28 DIAGNOSIS — R42 Dizziness and giddiness: Secondary | ICD-10-CM | POA: Diagnosis present

## 2016-10-28 DIAGNOSIS — G936 Cerebral edema: Secondary | ICD-10-CM | POA: Diagnosis not present

## 2016-10-28 DIAGNOSIS — I639 Cerebral infarction, unspecified: Secondary | ICD-10-CM | POA: Insufficient documentation

## 2016-10-28 LAB — POCT I-STAT CREATININE: CREATININE: 1 mg/dL (ref 0.61–1.24)

## 2016-10-28 MED ORDER — GADOBENATE DIMEGLUMINE 529 MG/ML IV SOLN
20.0000 mL | Freq: Once | INTRAVENOUS | Status: AC | PRN
  Administered 2016-10-28: 20 mL via INTRAVENOUS

## 2016-11-28 ENCOUNTER — Encounter: Payer: Self-pay | Admitting: Emergency Medicine

## 2016-11-28 ENCOUNTER — Encounter: Payer: Medicare Other | Attending: Cardiothoracic Surgery

## 2016-11-28 VITALS — Ht 72.25 in | Wt 209.6 lb

## 2016-11-28 DIAGNOSIS — Z952 Presence of prosthetic heart valve: Secondary | ICD-10-CM | POA: Insufficient documentation

## 2016-11-28 DIAGNOSIS — G4730 Sleep apnea, unspecified: Secondary | ICD-10-CM | POA: Insufficient documentation

## 2016-11-28 DIAGNOSIS — K219 Gastro-esophageal reflux disease without esophagitis: Secondary | ICD-10-CM | POA: Insufficient documentation

## 2016-11-28 DIAGNOSIS — K900 Celiac disease: Secondary | ICD-10-CM | POA: Insufficient documentation

## 2016-11-28 DIAGNOSIS — K2270 Barrett's esophagus without dysplasia: Secondary | ICD-10-CM | POA: Insufficient documentation

## 2016-11-28 NOTE — Progress Notes (Signed)
Cardiac Individual Treatment Plan  Patient Details  Name: Jeremy Mason MRN: [id number] Date of Birth: [birth year] Referring Provider:   Flowsheet Row Cardiac Rehab from 11/28/2016 in [facility] Cardiac and Pulmonary Rehab  Referring Provider  Gaca      Initial Encounter Date:  Flowsheet Row Cardiac Rehab from 11/28/2016 in [facility] Cardiac and Pulmonary Rehab  Date  11/28/16  Referring Provider  Gaca      Visit Diagnosis: S/P aortic valve replacement  Patient's Home Medications on Admission:  Current Outpatient Prescriptions:  .  clopidogrel (PLAVIX) 75 MG tablet, Take 75 mg by mouth daily., Disp: , Rfl:  .  metoprolol succinate (TOPROL-XL) 25 MG 24 hr tablet, Take 75 mg by mouth daily., Disp: , Rfl:  .  Multiple Vitamins-Minerals (ICAPS AREDS 2 PO), Take 1 tablet by mouth daily., Disp: , Rfl:  .  amLODipine (NORVASC) 5 MG tablet, Take 5 mg by mouth daily., Disp: , Rfl:  .  ARTIFICIAL TEAR OP, Apply to eye., Disp: , Rfl:  .  aspirin 325 MG tablet, Take 325 mg by mouth daily., Disp: , Rfl:  .  Cholecalciferol (VITAMIN D) 2000 units CAPS, Take by mouth daily., Disp: , Rfl:  .  Coenzyme Q10 (COQ10) 200 MG CAPS, Take by mouth daily., Disp: , Rfl:  .  dicyclomine (BENTYL) 10 MG capsule, Take 10 mg by mouth daily., Disp: , Rfl:  .  metoprolol tartrate (LOPRESSOR) 25 MG tablet, Take 12.5 mg by mouth daily., Disp: , Rfl:  .  olmesartan (BENICAR) 20 MG tablet, Take 20 mg by mouth daily., Disp: , Rfl:  .  pantoprazole (PROTONIX) 40 MG tablet, Take 40 mg by mouth daily., Disp: , Rfl:  .  potassium gluconate 595 (99 K) MG TABS tablet, Take 595 mg by mouth daily., Disp: , Rfl:  .  psyllium (METAMUCIL) 58.6 % powder, Take 1 packet by mouth daily., Disp: , Rfl:   Past Medical History: Past Medical History:  Diagnosis Date  . Aortic aneurysm (HCC)   . Aortic stenosis   . Arthritis    hips, legs  . Atrial fibrillation (HCC)   . Barrett's esophagus   . Celiac disease   . COPD (chronic obstructive  pulmonary disease) (HCC)   . GERD (gastroesophageal reflux disease)   . Heart murmur   . History of hiatal hernia   . Hypertension   . PONV (postoperative nausea and vomiting)    nausea after cataract  . Sleep apnea    no CPAP  . Vertigo    no episodes - 2-3 yrs    Tobacco Use: History  Smoking Status  . Former Smoker  . Quit date: 09/23/1996  Smokeless Tobacco  . Not on file    Labs: Recent Review Flowsheet Data    There is no flowsheet data to display.       Exercise Target Goals: Date: 11/28/16  Exercise Program Goal: Individual exercise prescription set with THRR, safety & activity barriers. Participant demonstrates ability to understand and report RPE using BORG scale, to self-measure pulse accurately, and to acknowledge the importance of the exercise prescription.  Exercise Prescription Goal: Starting with aerobic activity 30 plus minutes a day, 3 days per week for initial exercise prescription. Provide home exercise prescription and guidelines that participant acknowledges understanding prior to discharge.  Activity Barriers & Risk Stratification:   6 Minute Walk:     6 Minute Walk    Row Name 11/28/16 1528  6 Minute Walk   Distance 1404 feet     Walk Time 6 minutes     # of Rest Breaks 0     MPH 2.65     METS 2.83     RPE 13     VO2 Peak 9.9     Symptoms Yes (comment)     Comments Hip pain due to muscle atrophy from suregry 2 years ago     Resting HR 72 bpm     Resting BP 132/64     Max Ex. HR 97 bpm     Max Ex. BP 158/62        Oxygen Initial Assessment:   Oxygen Re-Evaluation:   Oxygen Discharge (Final Oxygen Re-Evaluation):   Initial Exercise Prescription:     Initial Exercise Prescription - 11/28/16 1500      Date of Initial Exercise RX and Referring Provider   Date 11/28/16   Referring Provider Gaca     Treadmill   MPH 2   Grade 0   Minutes 6  6/6     Recumbant Bike   Level 1   RPM 60   Minutes 15   METs  2.5     NuStep   Level 3   SPM 80   Minutes 15   METs 2.5     T5 Nustep   Level 2   SPM 60   Minutes 15   METs 2.5     Prescription Details   Frequency (times per week) 3   Duration Progress to 45 minutes of aerobic exercise without signs/symptoms of physical distress     Intensity   THRR 40-80% of Max Heartrate 100-129   Ratings of Perceived Exertion 11-15   Perceived Dyspnea 0-4     Resistance Training   Training Prescription Yes   Weight 3   Reps 10-15      Perform Capillary Blood Glucose checks as needed.  Exercise Prescription Changes:   Exercise Comments:   Exercise Goals and Review:   Exercise Goals Re-Evaluation :   Discharge Exercise Prescription (Final Exercise Prescription Changes):   Nutrition:  Target Goals: Understanding of nutrition guidelines, daily intake of sodium 1500mg , cholesterol 200mg , calories 30% from fat and 7% or less from saturated fats, daily to have 5 or more servings of fruits and vegetables.  Biometrics:     Pre Biometrics - 11/28/16 1527      Pre Biometrics   Height 6' 0.25" (1.835 m)   Weight 209 lb 9.6 oz (95.1 kg)   Waist Circumference 40.75 inches   Hip Circumference 43 inches   Waist to Hip Ratio 0.95 %   BMI (Calculated) 28.3   Single Leg Stand 22.41 seconds       Nutrition Therapy Plan and Nutrition Goals:     Nutrition Therapy & Goals - 11/28/16 1529      Nutrition Therapy   RD appointment defered Yes  Pt already had dietician appointment during cardiac rehab sessions at [facility]      Nutrition Discharge: Rate Your Plate Scores:     Nutrition Assessments - 11/28/16 1529      MEDFICTS Scores   Pre Score 134      Nutrition Goals Re-Evaluation:   Nutrition Goals Discharge (Final Nutrition Goals Re-Evaluation):   Psychosocial: Target Goals: Acknowledge presence or absence of significant depression and/or stress, maximize coping skills, provide positive support system. Participant  is able to verbalize types and ability to use techniques and skills needed  for reducing stress and depression.   Initial Review & Psychosocial Screening:     Initial Psych Review & Screening - 11/28/16 1530      Initial Review   Current issues with Current Sleep Concerns  Pt has sleep apnea and has a CPAP but states he is unable to use it because it "causes more sleep problems than when I don't use it"     Family Dynamics   Good Support System? Yes   Comments Lost a child 10 years ago.       Barriers   Psychosocial barriers to participate in program The patient should benefit from training in stress management and relaxation.     Screening Interventions   Interventions Encouraged to exercise      Quality of Life Scores:      Quality of Life - 11/28/16 1533      Quality of Life Scores   Health/Function Pre 22.4 %   Socioeconomic Pre 24 %   Psych/Spiritual Pre 24.86 %   Family Pre 21.6 %   GLOBAL Pre 23.09 %      PHQ-9: Recent Review Flowsheet Data    Depression screen [facility] 2/9 11/28/2016   Decreased Interest 0   Down, Depressed, Hopeless 0   PHQ - 2 Score 0   Altered sleeping 0   Tired, decreased energy 3   Change in appetite 0   Feeling bad or failure about yourself  0   Trouble concentrating 0   Moving slowly or fidgety/restless 0   Suicidal thoughts 0   PHQ-9 Score 3   Difficult doing work/chores Not difficult at all     Interpretation of Total Score  Total Score Depression Severity:  1-4 = Minimal depression, 5-9 = Mild depression, 10-14 = Moderate depression, 15-19 = Moderately severe depression, 20-27 = Severe depression   Psychosocial Evaluation and Intervention:   Psychosocial Re-Evaluation:   Psychosocial Discharge (Final Psychosocial Re-Evaluation):   Vocational Rehabilitation: Provide vocational rehab assistance to qualifying candidates.   Vocational Rehab Evaluation & Intervention:     Vocational Rehab - 11/28/16 1534      Initial  Vocational Rehab Evaluation & Intervention   Assessment shows need for Vocational Rehabilitation No  Pt is retired and declines      Education: Education Goals: Education classes will be provided on a weekly basis, covering required topics. Participant will state understanding/return demonstration of topics presented.  Learning Barriers/Preferences:     Learning Barriers/Preferences - 11/28/16 1534      Learning Barriers/Preferences   Learning Barriers None   Learning Preferences Audio;Individual Instruction;Verbal Instruction;Skilled Demonstration;Group Instruction      Education Topics: General Nutrition Guidelines/Fats and Fiber: -Group instruction provided by verbal, written material, models and posters to present the general guidelines for heart healthy nutrition. Gives an explanation and review of dietary fats and fiber.   Controlling Sodium/Reading Food Labels: -Group verbal and written material supporting the discussion of sodium use in heart healthy nutrition. Review and explanation with models, verbal and written materials for utilization of the food label.   Exercise Physiology & Risk Factors: - Group verbal and written instruction with models to review the exercise physiology of the cardiovascular system and associated critical values. Details cardiovascular disease risk factors and the goals associated with each risk factor.   Aerobic Exercise & Resistance Training: - Gives group verbal and written discussion on the health impact of inactivity. On the components of aerobic and resistive training programs and the benefits of this training  and how to safely progress through these programs.   Flexibility, Balance, General Exercise Guidelines: - Provides group verbal and written instruction on the benefits of flexibility and balance training programs. Provides general exercise guidelines with specific guidelines to those with heart or lung disease. Demonstration and  skill practice provided.   Stress Management: - Provides group verbal and written instruction about the health risks of elevated stress, cause of high stress, and healthy ways to reduce stress.   Depression: - Provides group verbal and written instruction on the correlation between heart/lung disease and depressed mood, treatment options, and the stigmas associated with seeking treatment.   Anatomy & Physiology of the Heart: - Group verbal and written instruction and models provide basic cardiac anatomy and physiology, with the coronary electrical and arterial systems. Review of: AMI, Angina, Valve disease, Heart Failure, Cardiac Arrhythmia, Pacemakers, and the ICD.   Cardiac Procedures: - Group verbal and written instruction and models to describe the testing methods done to diagnose heart disease. Reviews the outcomes of the test results. Describes the treatment choices: Medical Management, Angioplasty, or Coronary Bypass Surgery.   Cardiac Medications: - Group verbal and written instruction to review commonly prescribed medications for heart disease. Reviews the medication, class of the drug, and side effects. Includes the steps to properly store meds and maintain the prescription regimen.   Go Sex-Intimacy & Heart Disease, Get SMART - Goal Setting: - Group verbal and written instruction through game format to discuss heart disease and the return to sexual intimacy. Provides group verbal and written material to discuss and apply goal setting through the application of the S.M.A.R.T. Method.   Other Matters of the Heart: - Provides group verbal, written materials and models to describe Heart Failure, Angina, Valve Disease, and Diabetes in the realm of heart disease. Includes description of the disease process and treatment options available to the cardiac patient.   Exercise & Equipment Safety: - Individual verbal instruction and demonstration of equipment use and safety with use of  the equipment. Flowsheet Row Cardiac Rehab from 11/28/2016 in [facility] Cardiac and Pulmonary Rehab  Date  11/28/16  Educator  PS  Instruction Review Code  2- meets goals/outcomes      Infection Prevention: - Provides verbal and written material to individual with discussion of infection control including proper hand washing and proper equipment cleaning during exercise session. Flowsheet Row Cardiac Rehab from 11/28/2016 in [facility] Cardiac and Pulmonary Rehab  Date  11/28/16  Educator  PS  Instruction Review Code  2- meets goals/outcomes      Falls Prevention: - Provides verbal and written material to individual with discussion of falls prevention and safety. Flowsheet Row Cardiac Rehab from 11/28/2016 in [facility] Cardiac and Pulmonary Rehab  Date  11/28/16  Educator  PS  Instruction Review Code  2- meets goals/outcomes      Diabetes: - Individual verbal and written instruction to review signs/symptoms of diabetes, desired ranges of glucose level fasting, after meals and with exercise. Advice that pre and post exercise glucose checks will be done for 3 sessions at entry of program.    Knowledge Questionnaire Score:     Knowledge Questionnaire Score - 11/28/16 1534      Knowledge Questionnaire Score   Pre Score 21/28      Core Components/Risk Factors/Patient Goals at Admission:     Personal Goals and Risk Factors at Admission - 11/28/16 1526      Core Components/Risk Factors/Patient Goals on Admission    Weight Management  Yes   Intervention Weight Management: Develop a combined nutrition and exercise program designed to reach desired caloric intake, while maintaining appropriate intake of nutrient and fiber, sodium and fats, and appropriate energy expenditure required for the weight goal.;Weight Management: Provide education and appropriate resources to help participant work on and attain dietary goals.   Admit Weight 209 lb 9.6 oz (95.1 kg)   Goal Weight: Short Term 204 lb (92.5 kg)    Goal Weight: Long Term 195 lb (88.5 kg)   Expected Outcomes Short Term: Continue to assess and modify interventions until short term weight is achieved;Long Term: Adherence to nutrition and physical activity/exercise program aimed toward attainment of established weight goal;Weight Loss: Understanding of general recommendations for a balanced deficit meal plan, which promotes 1-2 lb weight loss per week and includes a negative energy balance of [phone] kcal/d;Understanding recommendations for meals to include 15-35% energy as protein, 25-35% energy from fat, 35-60% energy from carbohydrates, less than 200mg  of dietary cholesterol, 20-35 gm of total fiber daily;Understanding of distribution of calorie intake throughout the day with the consumption of 4-5 meals/snacks   Lipids Yes   Expected Outcomes Short Term: Participant states understanding of desired cholesterol values and is compliant with medications prescribed. Participant is following exercise prescription and nutrition guidelines.;Long Term: Cholesterol controlled with medications as prescribed, with individualized exercise RX and with personalized nutrition plan. Value goals: LDL < 70mg , HDL > 40 mg.      Core Components/Risk Factors/Patient Goals Review:    Core Components/Risk Factors/Patient Goals at Discharge (Final Review):    ITP Comments:     ITP Comments    Row Name 11/28/16 1507           ITP Comments Medical review completed;  initial ITP completed; diagnosis documentation is in [facility] discharge summary note by Dr. Zebedee IbaGaca on 09/02/2016          Comments:

## 2016-11-28 NOTE — Patient Instructions (Signed)
Patient Instructions  Patient Details  Name: Jeremy Mason MRN: [id number] Date of Birth: [birth year] Referring Provider:  Philip Aspen, MD  Below are the personal goals you chose as well as exercise and nutrition goals. Our goal is to help you keep on track towards obtaining and maintaining your goals. We will be discussing your progress on these goals with you throughout the program.  Initial Exercise Prescription:     Initial Exercise Prescription - 11/28/16 1500      Date of Initial Exercise RX and Referring Provider   Date 11/28/16   Referring Provider Gaca     Treadmill   MPH 2   Grade 0   Minutes 6  6/6     Recumbant Bike   Level 1   RPM 60   Minutes 15   METs 2.5     NuStep   Level 3   SPM 80   Minutes 15   METs 2.5     T5 Nustep   Level 2   SPM 60   Minutes 15   METs 2.5     Prescription Details   Frequency (times per week) 3   Duration Progress to 45 minutes of aerobic exercise without signs/symptoms of physical distress     Intensity   THRR 40-80% of Max Heartrate 100-129   Ratings of Perceived Exertion 11-15   Perceived Dyspnea 0-4     Resistance Training   Training Prescription Yes   Weight 3   Reps 10-15      Exercise Goals: Frequency: Be able to perform aerobic exercise three times per week working toward 3-5 days per week.  Intensity: Work with a perceived exertion of 11 (fairly light) - 15 (hard) as tolerated. Follow your new exercise prescription and watch for changes in prescription as you progress with the program. Changes will be reviewed with you when they are made.  Duration: You should be able to do 30 minutes of continuous aerobic exercise in addition to a 5 minute warm-up and a 5 minute cool-down routine.  Nutrition Goals: Your personal nutrition goals will be established when you do your nutrition analysis with the dietician.  The following are nutrition guidelines to follow: Cholesterol < 200mg /day Sodium <  1500mg /day Fiber: Men over 50 yrs - 30 grams per day  Personal Goals:     Personal Goals and Risk Factors at Admission - 11/28/16 1526      Core Components/Risk Factors/Patient Goals on Admission    Weight Management Yes   Intervention Weight Management: Develop a combined nutrition and exercise program designed to reach desired caloric intake, while maintaining appropriate intake of nutrient and fiber, sodium and fats, and appropriate energy expenditure required for the weight goal.;Weight Management: Provide education and appropriate resources to help participant work on and attain dietary goals.   Admit Weight 209 lb 9.6 oz (95.1 kg)   Goal Weight: Short Term 204 lb (92.5 kg)   Goal Weight: Long Term 195 lb (88.5 kg)   Expected Outcomes Short Term: Continue to assess and modify interventions until short term weight is achieved;Long Term: Adherence to nutrition and physical activity/exercise program aimed toward attainment of established weight goal;Weight Loss: Understanding of general recommendations for a balanced deficit meal plan, which promotes 1-2 lb weight loss per week and includes a negative energy balance of [phone] kcal/d;Understanding recommendations for meals to include 15-35% energy as protein, 25-35% energy from fat, 35-60% energy from carbohydrates, less than 200mg  of dietary cholesterol, 20-35 gm  of total fiber daily;Understanding of distribution of calorie intake throughout the day with the consumption of 4-5 meals/snacks   Lipids Yes   Expected Outcomes Short Term: Participant states understanding of desired cholesterol values and is compliant with medications prescribed. Participant is following exercise prescription and nutrition guidelines.;Long Term: Cholesterol controlled with medications as prescribed, with individualized exercise RX and with personalized nutrition plan. Value goals: LDL < 70mg , HDL > 40 mg.      Tobacco Use Initial Evaluation: History  Smoking  Status  . Former Smoker  . Quit date: 09/23/1996  Smokeless Tobacco  . Not on file    Copy of goals given to participant.

## 2016-12-02 ENCOUNTER — Encounter: Payer: Medicare Other | Admitting: *Deleted

## 2016-12-02 DIAGNOSIS — Z952 Presence of prosthetic heart valve: Secondary | ICD-10-CM

## 2016-12-02 NOTE — Progress Notes (Signed)
Daily Session Note  Patient Details  Name: Jeremy Mason MRN: [id number] Date of Birth: 1940-10-26 Referring Provider:   Flowsheet Row Cardiac Rehab from 11/28/2016 in [facility] Cardiac and Pulmonary Rehab  Referring Provider  Gaca      Encounter Date: 12/02/2016  Check In:     Session Check In - 12/02/16 0845      Check-In   Location [facility]-Cardiac & Pulmonary Rehab   Staff Present Alberteen Sam, MA, ACSM RCEP, Exercise Physiologist;Kelly Amedeo Plenty, BS, ACSM CEP, Exercise Physiologist;Akilah Cureton, RN, BSN   Supervising physician immediately available to respond to emergencies See telemetry face sheet for immediately available ER MD   Medication changes reported     No   Fall or balance concerns reported    No   Warm-up and Cool-down Performed on first and last piece of equipment   Resistance Training Performed Yes   VAD Patient? No     Pain Assessment   Currently in Pain? No/denies   Multiple Pain Sites No         History  Smoking Status  . Former Smoker  . Quit date: 09/23/1996  Smokeless Tobacco  . Not on file    Goals Met:  Independence with exercise equipment Exercise tolerated well Personal goals reviewed No report of cardiac concerns or symptoms Strength training completed today  Goals Unmet:  Not Applicable  Comments: First full day of exercise with [place]!  Dianna had transferred from [facility]. Patient was oriented to gym and equipment including functions, settings, policies, and procedures.  Patient's individual exercise prescription and treatment plan were reviewed.  All starting workloads were established based on the results of the 6 minute walk test done at initial orientation visit.  The plan for exercise progression was also introduced and progression will be customized based on patient's performance and goals.    Dr. Emily Filbert is Medical Director for [place] and [facility].

## 2016-12-04 ENCOUNTER — Encounter: Payer: Medicare Other | Admitting: *Deleted

## 2016-12-04 DIAGNOSIS — Z952 Presence of prosthetic heart valve: Secondary | ICD-10-CM | POA: Diagnosis not present

## 2016-12-04 NOTE — Progress Notes (Signed)
Daily Session Note  Patient Details  Name: DONNIVAN VILLENA MRN: [id number] Date of Birth: [birth year] Referring Provider:   Flowsheet Row Cardiac Rehab from 11/28/2016 in [facility] Cardiac and Pulmonary Rehab  Referring Provider  Gaca      Encounter Date: 12/04/2016  Check In:     Session Check In - 12/04/16 0924      Check-In   Staff Present Heath Lark, RN, BSN, Lance Sell, BA, ACSM CEP, Exercise Physiologist;Other  Darel Hong RN BSN   Supervising physician immediately available to respond to emergencies See telemetry face sheet for immediately available ER MD   Medication changes reported     No   Fall or balance concerns reported    No   Warm-up and Cool-down Performed on first and last piece of equipment   Resistance Training Performed Yes   VAD Patient? No     Pain Assessment   Currently in Pain? No/denies         History  Smoking Status  . Former Smoker  . Quit date: 09/23/1996  Smokeless Tobacco  . Not on file    Goals Met:  Exercise tolerated well No report of cardiac concerns or symptoms Strength training completed today  Goals Unmet:  Not Applicable  Comments: Doing well with exercise prescription progression.    Dr. Emily Filbert is Medical Director for [place] and [facility].

## 2016-12-06 ENCOUNTER — Encounter: Payer: Medicare Other | Admitting: *Deleted

## 2016-12-06 DIAGNOSIS — Z952 Presence of prosthetic heart valve: Secondary | ICD-10-CM | POA: Diagnosis not present

## 2016-12-06 NOTE — Progress Notes (Signed)
Daily Session Note  Patient Details  Name: Jeremy Mason MRN: [id number] Date of Birth: 01/05/1941 Referring Provider:     Cardiac Rehab from 11/28/2016 in [facility] Cardiac and Pulmonary Rehab  Referring Provider  Gaca      Encounter Date: 12/06/2016  Check In:     Session Check In - 12/06/16 0852      Check-In   Location [facility]-Cardiac & Pulmonary Rehab   Staff Present Gerlene Burdock, RN, Vickki Hearing, BA, ACSM CEP, Exercise Physiologist;Other  [place] physician immediately available to respond to emergencies See telemetry face sheet for immediately available ER MD   Medication changes reported     No   Warm-up and Cool-down Performed on first and last piece of equipment   VAD Patient? No     Pain Assessment   Currently in Pain? No/denies         History  Smoking Status  . Former Smoker  . Quit date: 09/23/1996  Smokeless Tobacco  . Not on file    Goals Met:  Proper associated with RPD/PD & O2 Sat Exercise tolerated well  Goals Unmet:  Not Applicable  Comments:     Dr. Emily Filbert is Medical Director for [place] and [facility].

## 2016-12-09 ENCOUNTER — Encounter: Payer: Medicare Other | Admitting: *Deleted

## 2016-12-09 DIAGNOSIS — Z952 Presence of prosthetic heart valve: Secondary | ICD-10-CM

## 2016-12-09 NOTE — Progress Notes (Signed)
Daily Session Note  Patient Details  Name: Jeremy Mason MRN: [id number] Date of Birth: 04/13/1941 Referring Provider:     Cardiac Rehab from 11/28/2016 in [facility]  Referring Provider  [place]      Encounter Date: 12/09/2016  Check In:     Session Check In - 12/09/16 0800      Check-In   Location [facility]   Staff Present Carroll Enterkin, RN, BSN;Kelly Hayes, BS, ACSM CEP, Exercise Physiologist;Jessica Hawkins, MA, ACSM RCEP, Exercise Physiologist   Supervising physician immediately available to respond to emergencies See telemetry face sheet for immediately available ER MD   Medication changes reported     No   Fall or balance concerns reported    No   Warm-up and Cool-down Performed on first and last piece of equipment   Resistance Training Performed Yes   VAD Patient? No     Pain Assessment   Currently in Pain? No/denies   Multiple Pain Sites No         History  Smoking Status  . Former Smoker  . Quit date: 09/23/1996  Smokeless Tobacco  . Not on file    Goals Met:  Independence with exercise equipment Exercise tolerated well No report of cardiac concerns or symptoms Strength training completed today Personal goals reviewed   Goals Unmet:  Not Applicable  Comments: Pt able to follow exercise prescription today without complaint.  Will continue to monitor for progression.    Dr. Mark Miller is Medical Director for [facility] and [facility]. 

## 2016-12-09 NOTE — Progress Notes (Signed)
Daily Session Note  Patient Details  Name: Jeremy Mason MRN: [id number] Date of Birth: 05-22-1941 Referring Provider:     Cardiac Rehab from 11/28/2016 in [facility]  Referring Provider  [place]      Encounter Date: 12/09/2016  Check In:     Session Check In - 12/09/16 0800      Check-In   Location [facility]   Staff Present Gerlene Burdock, RN, Moises Blood, BS, ACSM CEP, Exercise Physiologist;Emmilia Sowder Luan Pulling, [place], ACSM RCEP, Exercise Physiologist   Supervising physician immediately available to respond to emergencies See telemetry face sheet for immediately available ER MD   Medication changes reported     No   Fall or balance concerns reported    No   Warm-up and Cool-down Performed on first and last piece of equipment   Resistance Training Performed Yes   VAD Patient? No     Pain Assessment   Currently in Pain? No/denies   Multiple Pain Sites No         History  Smoking Status  . Former Smoker  . Quit date: 09/23/1996  Smokeless Tobacco  . Not on file    Goals Met:  Independence with exercise equipment Exercise tolerated well No report of cardiac concerns or symptoms Strength training completed today  Goals Unmet:  Not Applicable  Comments: Pt able to follow exercise prescription today without complaint.  Will continue to monitor for progression. See ITP for goal review   Dr. Emily Filbert is Medical Director for [place] and [facility].

## 2016-12-11 DIAGNOSIS — Z952 Presence of prosthetic heart valve: Secondary | ICD-10-CM | POA: Diagnosis not present

## 2016-12-11 NOTE — Progress Notes (Signed)
Daily Session Note  Patient Details  Name: Jeremy Mason MRN: [id number] Date of Birth: 06-16-1941 Referring Provider:     Cardiac Rehab from 11/28/2016 in [facility] Re[facility]  Referring Provider  [place]      Encounter Date: 12/11/2016  Check In:     Session Check In - 12/11/16 0825      Check-In   Location [facility]   Staff Present Alberteen Sam, MA, ACSM RCEP, Exercise Physiologist;Amanda Oletta Darter, BA, ACSM CEP, Exercise Physiologist;Susanne Bice, RN, BSN, CCRP   Supervising physician immediately available to respond to emergencies See telemetry face sheet for immediately available ER MD   Medication changes reported     No   Fall or balance concerns reported    No   Warm-up and Cool-down Performed on first and last piece of equipment   Resistance Training Performed Yes   VAD Patient? No     Pain Assessment   Currently in Pain? No/denies           Exercise Prescription Changes - 12/10/16 1500      Response to Exercise   Blood Pressure (Admit) 136/72   Blood Pressure (Exercise) 178/72   Blood Pressure (Exit) 120/70   Heart Rate (Admit) 66 bpm   Heart Rate (Exercise) 125 bpm   Heart Rate (Exit) 76 bpm   Rating of Perceived Exertion (Exercise) 12   Symptoms none   Comments transfer from [facility]   Duration Continue with 45 min of aerobic exercise without signs/symptoms of physical distress.   Intensity THRR unchanged     Progression   Progression Continue to progress workloads to maintain intensity without signs/symptoms of physical distress.   Average METs 4.6     Resistance Training   Training Prescription Yes   Weight 3 lbs   Reps 10-15     Interval Training   Interval Training Yes   Equipment T5 Nustep;Recumbant Bike;REL-XR   Comments 1 min on 3 min off     Recumbant Bike   Level 1   RPM 60   Minutes 15   METs 4.7     NuStep   Level 7   SPM 100   Minutes 15   METs 3.1     REL-XR   Level 2   Minutes 15    METs 6      History  Smoking Status  . Former Smoker  . Quit date: 09/23/1996  Smokeless Tobacco  . Not on file    Goals Met:  Independence with exercise equipment Exercise tolerated well No report of cardiac concerns or symptoms Strength training completed today  Goals Unmet:  Not Applicable  Comments: Pt able to follow exercise prescription today without complaint.  Will continue to monitor for progression.    Dr. Emily Filbert is Medical Director for [place] and [facility].

## 2016-12-13 DIAGNOSIS — Z952 Presence of prosthetic heart valve: Secondary | ICD-10-CM | POA: Diagnosis not present

## 2016-12-13 NOTE — Progress Notes (Signed)
Daily Session Note  Patient Details  Name: Jeremy Mason MRN: [id number] Date of Birth: 12-14-1940 Referring Provider:     Cardiac Rehab from 11/28/2016 in [facility] and [facility]monary Rehab  Referring Provider  [place]      Encounter Date: 12/13/2016  Check In:     Session Check In - 12/13/16 0835      Check-In   Location [facility]  Darel Hong RN BSN   Staff Present Nyoka Cowden, RN, BSN, Francene Boyers, DPT, CEEA   Supervising physician immediately available to respond to emergencies See telemetry face sheet for immediately available ER MD   Medication changes reported     No   Fall or balance concerns reported    No   Tobacco Cessation No Change   Warm-up and Cool-down Performed on first and last piece of equipment   Resistance Training Performed Yes   VAD Patient? No     Pain Assessment   Currently in Pain? No/denies   Multiple Pain Sites No         History  Smoking Status  . Former Smoker  . Quit date: 09/23/1996  Smokeless Tobacco  . Not on file    Goals Met:  Independence with exercise equipment  Goals Unmet:  Not Applicable  Comments: Patient completed exercise prescription and all exercise goals during rehab session. The exercise was tolerated well and the patient is progressing in the program.    Dr. Emily Filbert is Medical Director for [place] and [facility].

## 2016-12-16 ENCOUNTER — Encounter: Payer: Medicare Other | Admitting: *Deleted

## 2016-12-16 DIAGNOSIS — Z952 Presence of prosthetic heart valve: Secondary | ICD-10-CM

## 2016-12-16 NOTE — Progress Notes (Signed)
Daily Session Note  Patient Details  Name: Jeremy Mason MRN: [id number] Date of Birth: 02/11/1941 Referring Provider:     Cardiac Rehab from 11/28/2016 in [facility] Re[facility]  Referring Provider  [place]      Encounter Date: 12/16/2016  Check In:     Session Check In - 12/16/16 0757      Check-In   Location [facility]   Staff Present Alberteen Sam, MA, ACSM RCEP, Exercise Physiologist;[place] Teters Amedeo Plenty, BS, ACSM CEP, Exercise Physiologist;Carroll Enterkin, RN, BSN   Supervising physician immediately available to respond to emergencies See telemetry face sheet for immediately available ER MD   Medication changes reported     No   Fall or balance concerns reported    No   Warm-up and Cool-down Performed on first and last piece of equipment   Resistance Training Performed Yes   VAD Patient? No     Pain Assessment   Currently in Pain? No/denies   Multiple Pain Sites No         History  Smoking Status  . Former Smoker  . Quit date: 09/23/1996  Smokeless Tobacco  . Not on file    Goals Met:  Independence with exercise equipment Exercise tolerated well No report of cardiac concerns or symptoms Strength training completed today  Goals Unmet:  Not Applicable  Comments: Pt able to follow exercise prescription today without complaint.  Will continue to monitor for progression.    Dr. Emily Filbert is Medical Director for [place] and [facility].

## 2016-12-18 ENCOUNTER — Encounter: Payer: Medicare Other | Admitting: *Deleted

## 2016-12-18 ENCOUNTER — Encounter: Payer: Self-pay | Admitting: *Deleted

## 2016-12-18 DIAGNOSIS — Z952 Presence of prosthetic heart valve: Secondary | ICD-10-CM

## 2016-12-18 NOTE — Progress Notes (Signed)
Cardiac Individual Treatment Plan  Patient Details  Name: [name] MRN: [id number] Date of Birth: Feb 14, 1941 Referring Provider:     Cardiac Rehab from 11/28/2016 in [facility] Cardiac and Pulmonary Rehab  Referring Provider  Gaca      Initial Encounter Date:    Cardiac Rehab from 11/28/2016 in [facility] Cardiac and Pulmonary Rehab  Date  11/28/16  Referring Provider  Gaca      Visit Diagnosis: S/P aortic valve replacement  Patient's Home Medications on Admission:  Current Outpatient Prescriptions:  .  amLODipine (NORVASC) 5 MG tablet, Take 5 mg by mouth daily., Disp: , Rfl:  .  ARTIFICIAL TEAR OP, Apply to eye., Disp: , Rfl:  .  aspirin 325 MG tablet, Take 325 mg by mouth daily., Disp: , Rfl:  .  Cholecalciferol (VITAMIN D) 2000 units CAPS, Take by mouth daily., Disp: , Rfl:  .  clopidogrel (PLAVIX) 75 MG tablet, Take 75 mg by mouth daily., Disp: , Rfl:  .  Coenzyme Q10 (COQ10) 200 MG CAPS, Take by mouth daily., Disp: , Rfl:  .  dicyclomine (BENTYL) 10 MG capsule, Take 10 mg by mouth daily., Disp: , Rfl:  .  metoprolol succinate (TOPROL-XL) 25 MG 24 hr tablet, Take 75 mg by mouth daily., Disp: , Rfl:  .  metoprolol tartrate (LOPRESSOR) 25 MG tablet, Take 12.5 mg by mouth daily., Disp: , Rfl:  .  Multiple Vitamins-Minerals (ICAPS AREDS 2 PO), Take 1 tablet by mouth daily., Disp: , Rfl:  .  olmesartan (BENICAR) 20 MG tablet, Take 20 mg by mouth daily., Disp: , Rfl:  .  pantoprazole (PROTONIX) 40 MG tablet, Take 40 mg by mouth daily., Disp: , Rfl:  .  potassium gluconate 595 (99 K) MG TABS tablet, Take 595 mg by mouth daily., Disp: , Rfl:  .  psyllium (METAMUCIL) 58.6 % powder, Take 1 packet by mouth daily., Disp: , Rfl:   Past Medical History: Past Medical History:  Diagnosis Date  . Aortic aneurysm ([place])   . Aortic stenosis   . Arthritis    hips, legs  . Atrial fibrillation ([place])   . Barrett's esophagus   . Celiac disease   . COPD (chronic obstructive pulmonary disease) ([place])    . GERD (gastroesophageal reflux disease)   . Heart murmur   . History of hiatal hernia   . Hypertension   . PONV (postoperative nausea and vomiting)    nausea after cataract  . Sleep apnea    no CPAP  . Vertigo    no episodes - 2-3 yrs    Tobacco Use: History  Smoking Status  . Former Smoker  . Quit date: 09/23/1996  Smokeless Tobacco  . Not on file    Labs: Recent Review Flowsheet Data    There is no flowsheet data to display.       Exercise Target Goals:    Exercise Program Goal: Individual exercise prescription set with THRR, safety & activity barriers. Participant demonstrates ability to understand and report RPE using BORG scale, to self-measure pulse accurately, and to acknowledge the importance of the exercise prescription.  Exercise Prescription Goal: Starting with aerobic activity 30 plus minutes a day, 3 days per week for initial exercise prescription. Provide home exercise prescription and guidelines that participant acknowledges understanding prior to discharge.  Activity Barriers & Risk Stratification:   6 Minute Walk:     6 Minute Walk    Row Name 11/28/16 1528  6 Minute Walk   Distance 1404 feet     Walk Time 6 minutes     # of Rest Breaks 0     MPH 2.65     METS 2.83     RPE 13     VO2 Peak 9.9     Symptoms Yes (comment)     Comments Hip pain due to muscle atrophy from suregry 2 years ago     Resting HR 72 bpm     Resting BP 132/64     Max Ex. HR 97 bpm     Max Ex. BP 158/62        Oxygen Initial Assessment:   Oxygen Re-Evaluation:   Oxygen Discharge (Final Oxygen Re-Evaluation):   Initial Exercise Prescription:     Initial Exercise Prescription - 11/28/16 1500      Date of Initial Exercise RX and Referring Provider   Date 11/28/16   Referring Provider Gaca     Treadmill   MPH 2   Grade 0   Minutes 6  6/6     Recumbant Bike   Level 1   RPM 60   Minutes 15   METs 2.5     NuStep   Level 3   SPM 80    Minutes 15   METs 2.5     T5 Nustep   Level 2   SPM 60   Minutes 15   METs 2.5     Prescription Details   Frequency (times per week) 3   Duration Progress to 45 minutes of aerobic exercise without signs/symptoms of physical distress     Intensity   THRR 40-80% of Max Heartrate 100-129   Ratings of Perceived Exertion 11-15   Perceived Dyspnea 0-4     Resistance Training   Training Prescription Yes   Weight 3   Reps 10-15      Perform Capillary Blood Glucose checks as needed.  Exercise Prescription Changes:     Exercise Prescription Changes    Row Name 12/10/16 1500             Response to Exercise   Blood Pressure (Admit) 136/72       Blood Pressure (Exercise) 178/72       Blood Pressure (Exit) 120/70       Heart Rate (Admit) 66 bpm       Heart Rate (Exercise) 125 bpm       Heart Rate (Exit) 76 bpm       Rating of Perceived Exertion (Exercise) 12       Symptoms none       Comments transfer from [facility]       Duration Continue with 45 min of aerobic exercise without signs/symptoms of physical distress.       Intensity THRR unchanged         Progression   Progression Continue to progress workloads to maintain intensity without signs/symptoms of physical distress.       Average METs 4.6         Resistance Training   Training Prescription Yes       Weight 3 lbs       Reps 10-15         Interval Training   Interval Training Yes       Equipment T5 Nustep;Recumbant Bike;REL-XR       Comments 1 min on 3 min off         Recumbant Bike   Level 1  RPM 60       Minutes 15       METs 4.7         NuStep   Level 7       SPM 100       Minutes 15       METs 3.1         REL-XR   Level 2       Minutes 15       METs 6          Exercise Comments:     Exercise Comments    Row Name 12/02/16 0847 12/06/16 0854         Exercise Comments First full day of exercise with [place]!  Nikolos had transferred from [facility]. Patient was oriented to gym  and equipment including functions, settings, policies, and procedures.  Patient's individual exercise prescription and treatment plan were reviewed.  All starting workloads were established based on the results of the 6 minute walk test done at initial orientation visit.  The plan for exercise progression was also introduced and progression will be customized based on patient's performance and goals. Koron says he prefers not to use the Recumbent Bike since this one does not allow him to use his arms. I suggested the Airdyne bicycle since you move your feet and arms and he wanted to wait until the next Cardiac Rehab session to try that         Exercise Goals and Review:   Exercise Goals Re-Evaluation :     Exercise Goals Re-Evaluation    Row Name 12/09/16 0926             Exercise Goal Re-Evaluation   Exercise Goals Review Increase Physical Activity;Increase Strenth and Stamina       Comments Rael started intervals today on the Recumbent bike and XR.  He is feeling stronger overall.  He is not currently walking or exercising at home.         Expected Outcomes Short; Review home exercise guidelines.  Long: Continue to attend rehab to make exercise part of his routine.          Discharge Exercise Prescription (Final Exercise Prescription Changes):     Exercise Prescription Changes - 12/10/16 1500      Response to Exercise   Blood Pressure (Admit) 136/72   Blood Pressure (Exercise) 178/72   Blood Pressure (Exit) 120/70   Heart Rate (Admit) 66 bpm   Heart Rate (Exercise) 125 bpm   Heart Rate (Exit) 76 bpm   Rating of Perceived Exertion (Exercise) 12   Symptoms none   Comments transfer from [facility]   Duration Continue with 45 min of aerobic exercise without signs/symptoms of physical distress.   Intensity THRR unchanged     Progression   Progression Continue to progress workloads to maintain intensity without signs/symptoms of physical distress.   Average METs 4.6      Resistance Training   Training Prescription Yes   Weight 3 lbs   Reps 10-15     Interval Training   Interval Training Yes   Equipment T5 Nustep;Recumbant Bike;REL-XR   Comments 1 min on 3 min off     Recumbant Bike   Level 1   RPM 60   Minutes 15   METs 4.7     NuStep   Level 7   SPM 100   Minutes 15   METs 3.1     REL-XR   Level  2   Minutes 15   METs 6      Nutrition:  Target Goals: Understanding of nutrition guidelines, daily intake of sodium '1500mg'$ , cholesterol '200mg'$ , calories 30% from fat and 7% or less from saturated fats, daily to have 5 or more servings of fruits and vegetables.  Biometrics:     Pre Biometrics - 11/28/16 1527      Pre Biometrics   Height 6' 0.25" (1.835 m)   Weight 209 lb 9.6 oz (95.1 kg)   Waist Circumference 40.75 inches   Hip Circumference 43 inches   Waist to Hip Ratio 0.95 %   BMI (Calculated) 28.3   Single Leg Stand 22.41 seconds       Nutrition Therapy Plan and Nutrition Goals:     Nutrition Therapy & Goals - 11/28/16 1529      Nutrition Therapy   RD appointment defered Yes  Pt already had dietician appointment during cardiac rehab sessions at [place] Discharge: Rate Your Plate Scores:     Nutrition Assessments - 11/28/16 1529      MEDFICTS Scores   Pre Score 134      Nutrition Goals Re-Evaluation:     Nutrition Goals Re-Evaluation    [place] Name 12/09/16 0948             Goals   Current Weight 208 lb (94.3 kg)       Comment Met with dietician previously while in [facility] and does not want to meet now.  He says he knows his biggest issue in portion control       Expected Outcome Short and Long: Work on portion control.          Nutrition Goals Discharge (Final Nutrition Goals Re-Evaluation):     Nutrition Goals Re-Evaluation - 12/09/16 0948      Goals   Current Weight 208 lb (94.3 kg)   Comment Met with dietician previously while in [facility] [place] and does not want to meet  now.  He says he knows his biggest issue in portion control   Expected Outcome Short and Long: Work on portion control.      Psychosocial: Target Goals: Acknowledge presence or absence of significant depression and/or stress, maximize coping skills, provide positive support system. Participant is able to verbalize types and ability to use techniques and skills needed for reducing stress and depression.   Initial Review & Psychosocial Screening:     Initial Psych Review & Screening - 11/28/16 1530      Initial Review   Current issues with Current Sleep Concerns  Pt has sleep apnea and has a CPAP but states he is unable to use it because it "causes more sleep problems than when I don't use it"     [place]? Yes   Comments Lost a child 10 years ago.       Barriers   Psychosocial barriers to participate in program The patient should benefit from training in stress management and relaxation.     Screening Interventions   Interventions Encouraged to exercise      Quality of Life Scores:      Quality of Life - 11/28/16 1533      Quality of Life Scores   Health/Function Pre 22.4 %   Socioeconomic Pre 24 %   Psych/Spiritual Pre 24.86 %   Family Pre 21.6 %   GLOBAL Pre 23.09 %      PHQ-9:  Recent Review Flowsheet Data    Depression screen [facility] 2/9 11/28/2016   Decreased Interest 0   Down, Depressed, Hopeless 0   PHQ - 2 Score 0   Altered sleeping 0   Tired, decreased energy 3   Change in appetite 0   Feeling bad or failure about yourself  0   Trouble concentrating 0   Moving slowly or fidgety/restless 0   Suicidal thoughts 0   PHQ-9 Score 3   Difficult doing work/chores Not difficult at all     Interpretation of Total Score  Total Score Depression Severity:  1-4 = Minimal depression, 5-9 = Mild depression, 10-14 = Moderate depression, 15-19 = Moderately severe depression, 20-27 = Severe depression   Psychosocial Evaluation and  Intervention:     Psychosocial Evaluation - 12/11/16 0922      Psychosocial Evaluation & Interventions   Interventions Encouraged to exercise with the program and follow exercise prescription   Comments Counselor met today with Mr. Ariola Hartline) for initial psychosocial evaluation.  He is a 76 yr old who had open heart surgery in December for an aortic valve replacement.  He has a strong support system with a spouse of 55 years; a daughter who lives locally; and active involvement in his church community.  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  He states that he sleeps well and has a "great" appetite.  Jakobie denies a history of depression or anxiety or any current symptoms.  He states he is typically in a positive mood and has minimal stress in his life.  His goals for this program are to increase his stamina and strength and lose some weight.  He plans to maintain exercising consistently following this program by use of home equipment.   Staff will follow with Mario throughout the course of this program.     Expected Outcomes Fermon will exercise consistently with this program to increase his stamina and strength.  He will meet with the dietician to address his weight loss goals.       Continue Psychosocial Services  Follow up required by staff      Psychosocial Re-Evaluation:     Psychosocial Re-Evaluation    Row Name 12/09/16 [phone]             Psychosocial Re-Evaluation   Current issues with Current Stress Concerns       Comments Sleeping pretty good.  Tries not to let stress get to him.  However, his daughter has just been diagnosised with ovarian cancer for the 3rd time.  He participates in antique tractor pull for fun.  He uses this as part of his rehab.        Expected Outcomes Short: Meet with Olegario Messier   Long: Continue to exercise and use his hobbies as outlets       Interventions Encouraged to  attend Cardiac Rehabilitation for the exercise;Therapist referral       Continue Psychosocial Services  Follow up required by counselor         Initial Review   Source of Stress Concerns Family          Psychosocial Discharge (Final Psychosocial Re-Evaluation):     Psychosocial Re-Evaluation - 12/09/16 0951      Psychosocial Re-Evaluation   Current issues with Current Stress Concerns   Comments Sleeping pretty good.  Tries not to let stress get to him.  However,  his daughter has just been diagnosised with ovarian cancer for the 3rd time.  He participates in antique tractor pull for fun.  He uses this as part of his rehab.    Expected Outcomes Short: Meet with Olegario Messier   Long: Continue to exercise and use his hobbies as outlets   Interventions Encouraged to attend Cardiac Rehabilitation for the exercise;Therapist referral   Continue Psychosocial Services  Follow up required by counselor     Initial Review   Source of Stress Concerns Family      Vocational Rehabilitation: Provide vocational rehab assistance to qualifying candidates.   Vocational Rehab Evaluation & Intervention:     Vocational Rehab - 11/28/16 1534      Initial Vocational Rehab Evaluation & Intervention   Assessment shows need for Vocational Rehabilitation No  Pt is retired and declines      Education: Education Goals: Education classes will be provided on a weekly basis, covering required topics. Participant will state understanding/return demonstration of topics presented.  Learning Barriers/Preferences:     Learning Barriers/Preferences - 11/28/16 1534      Learning Barriers/Preferences   Learning Barriers None   Learning Preferences Audio;Individual Instruction;Verbal Instruction;Skilled Demonstration;Group Instruction      Education Topics: General Nutrition Guidelines/Fats and Fiber: -Group instruction provided by verbal, written material, models and posters to present the general guidelines for  heart healthy nutrition. Gives an explanation and review of dietary fats and fiber.   Controlling Sodium/Reading Food Labels: -Group verbal and written material supporting the discussion of sodium use in heart healthy nutrition. Review and explanation with models, verbal and written materials for utilization of the food label.   Cardiac Rehab from 12/16/2016 in [facility] Cardiac and Pulmonary Rehab  Date  12/02/16  Educator  CR  Instruction Review Code  2- meets goals/outcomes      Exercise Physiology & Risk Factors: - Group verbal and written instruction with models to review the exercise physiology of the cardiovascular system and associated critical values. Details cardiovascular disease risk factors and the goals associated with each risk factor.   Cardiac Rehab from 12/16/2016 in [facility] Cardiac and Pulmonary Rehab  Date  12/09/16  Educator  [facility]  Instruction Review Code  2- meets goals/outcomes      Aerobic Exercise & Resistance Training: - Gives group verbal and written discussion on the health impact of inactivity. On the components of aerobic and resistive training programs and the benefits of this training and how to safely progress through these programs.   Cardiac Rehab from 12/16/2016 in [facility] Cardiac and Pulmonary Rehab  Date  12/11/16  Educator  [facility]  Instruction Review Code  2- meets goals/outcomes      Flexibility, Balance, General Exercise Guidelines: - Provides group verbal and written instruction on the benefits of flexibility and balance training programs. Provides general exercise guidelines with specific guidelines to those with heart or lung disease. Demonstration and skill practice provided.   Cardiac Rehab from 12/16/2016 in [facility] Cardiac and Pulmonary Rehab  Date  12/16/16  Educator  [facility]  Instruction Review Code  2- meets goals/outcomes      Stress Management: - Provides group verbal and written instruction about the health risks of elevated stress, cause of high  stress, and healthy ways to reduce stress.   Depression: - Provides group verbal and written instruction on the correlation between heart/lung disease and depressed mood, treatment options, and the stigmas associated with seeking treatment.   Anatomy & Physiology of the Heart: - Group  verbal and written instruction and models provide basic cardiac anatomy and physiology, with the coronary electrical and arterial systems. Review of: AMI, Angina, Valve disease, Heart Failure, Cardiac Arrhythmia, Pacemakers, and the ICD.   Cardiac Procedures: - Group verbal and written instruction and models to describe the testing methods done to diagnose heart disease. Reviews the outcomes of the test results. Describes the treatment choices: Medical Management, Angioplasty, or Coronary Bypass Surgery.   Cardiac Medications: - Group verbal and written instruction to review commonly prescribed medications for heart disease. Reviews the medication, class of the drug, and side effects. Includes the steps to properly store meds and maintain the prescription regimen.   Go Sex-Intimacy & Heart Disease, Get SMART - Goal Setting: - Group verbal and written instruction through game format to discuss heart disease and the return to sexual intimacy. Provides group verbal and written material to discuss and apply goal setting through the application of the S.M.A.R.T. Method.   Other Matters of the Heart: - Provides group verbal, written materials and models to describe Heart Failure, Angina, Valve Disease, and Diabetes in the realm of heart disease. Includes description of the disease process and treatment options available to the cardiac patient.   Exercise & Equipment Safety: - Individual verbal instruction and demonstration of equipment use and safety with use of the equipment.   Cardiac Rehab from 12/16/2016 in [facility] Cardiac and Pulmonary Rehab  Date  11/28/16  Educator  PS  Instruction Review Code  2- meets  goals/outcomes      Infection Prevention: - Provides verbal and written material to individual with discussion of infection control including proper hand washing and proper equipment cleaning during exercise session.   Cardiac Rehab from 12/16/2016 in [facility] Cardiac and Pulmonary Rehab  Date  11/28/16  Educator  PS  Instruction Review Code  2- meets goals/outcomes      Falls Prevention: - Provides verbal and written material to individual with discussion of falls prevention and safety.   Cardiac Rehab from 12/16/2016 in [facility] Cardiac and Pulmonary Rehab  Date  11/28/16  Educator  PS  Instruction Review Code  2- meets goals/outcomes      Diabetes: - Individual verbal and written instruction to review signs/symptoms of diabetes, desired ranges of glucose level fasting, after meals and with exercise. Advice that pre and post exercise glucose checks will be done for 3 sessions at entry of program.    Knowledge Questionnaire Score:     Knowledge Questionnaire Score - 11/28/16 1534      Knowledge Questionnaire Score   Pre Score 21/28      Core Components/Risk Factors/Patient Goals at Admission:     Personal Goals and Risk Factors at Admission - 11/28/16 1526      Core Components/Risk Factors/Patient Goals on Admission    Weight Management Yes   Intervention Weight Management: Develop a combined nutrition and exercise program designed to reach desired caloric intake, while maintaining appropriate intake of nutrient and fiber, sodium and fats, and appropriate energy expenditure required for the weight goal.;Weight Management: Provide education and appropriate resources to help participant work on and attain dietary goals.   Admit Weight 209 lb 9.6 oz (95.1 kg)   Goal Weight: Short Term 204 lb (92.5 kg)   Goal Weight: Long Term 195 lb (88.5 kg)   Expected Outcomes Short Term: Continue to assess and modify interventions until short term weight is achieved;Long Term: Adherence to  nutrition and physical activity/exercise program aimed toward attainment of established weight  goal;Weight Loss: Understanding of general recommendations for a balanced deficit meal plan, which promotes 1-2 lb weight loss per week and includes a negative energy balance of [phone] kcal/d;Understanding recommendations for meals to include 15-35% energy as protein, 25-35% energy from fat, 35-60% energy from carbohydrates, less than '200mg'$  of dietary cholesterol, 20-35 gm of total fiber daily;Understanding of distribution of calorie intake throughout the day with the consumption of 4-5 meals/snacks   Lipids Yes   Expected Outcomes Short Term: Participant states understanding of desired cholesterol values and is compliant with medications prescribed. Participant is following exercise prescription and nutrition guidelines.;Long Term: Cholesterol controlled with medications as prescribed, with individualized exercise RX and with personalized nutrition plan. Value goals: LDL < '70mg'$ , HDL > 40 mg.      Core Components/Risk Factors/Patient Goals Review:      Goals and Risk Factor Review    Row Name 12/09/16 0937             Core Components/Risk Factors/Patient Goals Review   Personal Goals Review Weight Management/Obesity;Hypertension;Lipids       Review Hagen has been doing well in rehab since his trasnfer.  His blood pressures have been good and he has not had any problems with his medications.  His weight has also been steady.       Expected Outcomes Short and Long: Continue to work on his weight and attend rehab to work on risk factor modification.          Core Components/Risk Factors/Patient Goals at Discharge (Final Review):      Goals and Risk Factor Review - 12/09/16 0937      Core Components/Risk Factors/Patient Goals Review   Personal Goals Review Weight Management/Obesity;Hypertension;Lipids   Review Woodie has been doing well in rehab since his trasnfer.  His blood pressures have been  good and he has not had any problems with his medications.  His weight has also been steady.   Expected Outcomes Short and Long: Continue to work on his weight and attend rehab to work on risk factor modification.      ITP Comments:     ITP Comments    Row Name 11/28/16 1507 12/06/16 0853 12/18/16 0544       ITP Comments Medical review completed;  initial ITP completed; diagnosis documentation is in [place] discharge summary note by Dr. Cheree Ditto on 09/02/2016 Jay[place]ison says he prefers not to use the Recumbent Bike since this one does not allow him to use his arms. I suggested the Airdyne bicycle since you move your feet and arms and he wanted to wait until the next Cardiac Rehab session to try that. 30 day review. Continue with ITP unless directed changes per Medical Director review        Comments:

## 2016-12-18 NOTE — Progress Notes (Signed)
Daily Session Note  Patient Details  Name: Jeremy Mason MRN: [id number] Date of Birth: 1940-10-29 Referring Provider:     Cardiac Rehab from 11/28/2016 in [facility] Cardiac and Pulmonary Rehab  Referring Provider  Gaca      Encounter Date: 12/18/2016  Check In:     Session Check In - 12/18/16 0916      Check-In   Location [facility]-Cardiac & Pulmonary Rehab   Staff Present Alberteen Sam, MA, ACSM RCEP, Exercise Physiologist;Susanne Bice, RN, BSN, Lance Sell, BA, ACSM CEP, Exercise Physiologist   Supervising physician immediately available to respond to emergencies See telemetry face sheet for immediately available ER MD   Medication changes reported     No   Fall or balance concerns reported    No   Warm-up and Cool-down Performed on first and last piece of equipment   Resistance Training Performed Yes   VAD Patient? No     Pain Assessment   Currently in Pain? No/denies   Multiple Pain Sites No         History  Smoking Status  . Former Smoker  . Quit date: 09/23/1996  Smokeless Tobacco  . Not on file    Goals Met:  Independence with exercise equipment Exercise tolerated well No report of cardiac concerns or symptoms Strength training completed today  Goals Unmet:  Not Applicable  Comments: Pt able to follow exercise prescription today without complaint.  Will continue to monitor for progression. Reviewed home exercise with pt today.  Pt plans to do chair exercises and weights at home for exercise.  Reviewed THR, pulse, RPE, sign and symptoms, and when to call 911 or MD.  Also discussed weather considerations and indoor options.  Pt voiced understanding.    Dr. Emily Filbert is Medical Director for [place] and [facility].

## 2016-12-20 ENCOUNTER — Encounter: Payer: Medicare Other | Admitting: *Deleted

## 2016-12-20 DIAGNOSIS — Z952 Presence of prosthetic heart valve: Secondary | ICD-10-CM | POA: Diagnosis not present

## 2016-12-20 NOTE — Progress Notes (Signed)
Daily Session Note  Patient Details  Name: Jeremy Mason MRN: [id number] Date of Birth: 10-01-1940 Referring Provider:     Cardiac Rehab from 11/28/2016 in [facility] Cardiac and Pulmonary Rehab  Referring Provider  Gaca      Encounter Date: 12/20/2016  Check In:     Session Check In - 12/20/16 5400      Check-In   Location [facility]-Cardiac & Pulmonary Rehab   Staff Present Gerlene Burdock, RN, BSN;Susanne Bice, RN, BSN, CCRP;Jessica Luan Pulling, MA, ACSM RCEP, Exercise Physiologist   Supervising physician immediately available to respond to emergencies See telemetry face sheet for immediately available ER MD   Medication changes reported     No   Fall or balance concerns reported    Yes   Warm-up and Cool-down Performed on first and last piece of equipment   Resistance Training Performed No     Pain Assessment   Currently in Pain? No/denies         History  Smoking Status  . Former Smoker  . Quit date: 09/23/1996  Smokeless Tobacco  . Not on file    Goals Met:  Proper associated with RPD/PD & O2 Sat Exercise tolerated well  Goals Unmet:  Not Applicable  Comments:     Dr. Emily Filbert is Medical Director for [place] and [facility].

## 2016-12-23 ENCOUNTER — Encounter: Payer: Medicare Other | Attending: Cardiothoracic Surgery | Admitting: *Deleted

## 2016-12-23 DIAGNOSIS — Z952 Presence of prosthetic heart valve: Secondary | ICD-10-CM | POA: Diagnosis present

## 2016-12-23 NOTE — Progress Notes (Signed)
Daily Session Note  Patient Details  Name: Jeremy Mason MRN: [id number] Date of Birth: 12-17-1940 Referring Provider:     Cardiac Rehab from 11/28/2016 in [facility] Cardiac and Pulmonary Rehab  Referring Provider  Gaca      Encounter Date: 12/23/2016  Check In:     Session Check In - 12/23/16 0757      Check-In   Location [facility]-Cardiac & Pulmonary Rehab   Staff Present Alberteen Sam, MA, ACSM RCEP, Exercise Physiologist;Kelly Amedeo Plenty, BS, ACSM CEP, Exercise Physiologist;Carroll Enterkin, RN, BSN   Supervising physician immediately available to respond to emergencies See telemetry face sheet for immediately available ER MD   Medication changes reported     No   Fall or balance concerns reported    No   Warm-up and Cool-down Performed on first and last piece of equipment   Resistance Training Performed Yes   VAD Patient? No     Pain Assessment   Currently in Pain? No/denies   Multiple Pain Sites No         History  Smoking Status  . Former Smoker  . Quit date: 09/23/1996  Smokeless Tobacco  . Not on file    Goals Met:  Independence with exercise equipment Exercise tolerated well No report of cardiac concerns or symptoms Strength training completed today  Goals Unmet:  Not Applicable  Comments: Pt able to follow exercise prescription today without complaint.  Will continue to monitor for progression.    Dr. Emily Filbert is Medical Director for [place] and [facility].

## 2016-12-25 ENCOUNTER — Encounter: Payer: Medicare Other | Admitting: *Deleted

## 2016-12-25 DIAGNOSIS — Z952 Presence of prosthetic heart valve: Secondary | ICD-10-CM | POA: Diagnosis not present

## 2016-12-25 NOTE — Progress Notes (Signed)
Daily Session Note  Patient Details  Name: ROLLYN SCIALDONE MRN: [id number] Date of Birth: June 22, 1941 Referring Provider:     Cardiac Rehab from 11/28/2016 in [facility] Cardiac and Pulmonary Rehab  Referring Provider  Gaca      Encounter Date: 12/25/2016  Check In:     Session Check In - 12/25/16 0859      Check-In   Location [facility]-Cardiac & Pulmonary Rehab   Staff Present Alberteen Sam, MA, ACSM RCEP, Exercise Physiologist;Susanne Bice, RN, BSN, Lance Sell, BA, ACSM CEP, Exercise Physiologist   Supervising physician immediately available to respond to emergencies See telemetry face sheet for immediately available ER MD   Medication changes reported     No   Fall or balance concerns reported    No   Warm-up and Cool-down Performed on first and last piece of equipment   Resistance Training Performed Yes   VAD Patient? No     Pain Assessment   Currently in Pain? No/denies   Multiple Pain Sites No         History  Smoking Status  . Former Smoker  . Quit date: 09/23/1996  Smokeless Tobacco  . Not on file    Goals Met:  Independence with exercise equipment Exercise tolerated well No report of cardiac concerns or symptoms Strength training completed today  Goals Unmet:  Not Applicable  Comments: Pt able to follow exercise prescription today without complaint.  Will continue to monitor for progression.    Dr. Emily Filbert is Medical Director for [place] and [facility].

## 2016-12-27 ENCOUNTER — Encounter: Payer: Medicare Other | Admitting: *Deleted

## 2016-12-27 ENCOUNTER — Ambulatory Visit: Payer: Medicare Other | Attending: Neurology

## 2016-12-27 DIAGNOSIS — R279 Unspecified lack of coordination: Secondary | ICD-10-CM | POA: Diagnosis present

## 2016-12-27 DIAGNOSIS — M6281 Muscle weakness (generalized): Secondary | ICD-10-CM | POA: Diagnosis present

## 2016-12-27 DIAGNOSIS — Z952 Presence of prosthetic heart valve: Secondary | ICD-10-CM | POA: Diagnosis not present

## 2016-12-27 DIAGNOSIS — R2681 Unsteadiness on feet: Secondary | ICD-10-CM | POA: Insufficient documentation

## 2016-12-27 NOTE — Progress Notes (Signed)
Daily Session Note  Patient Details  Name: TOSHIYUKI FREDELL MRN: [id number] Date of Birth: Apr 26, 1941 Referring Provider:     Cardiac Rehab from 11/28/2016 in [facility] Cardiac and Pulmonary Rehab  Referring Provider  Gaca      Encounter Date: 12/27/2016  Check In:     Session Check In - 12/27/16 0854      Check-In   Staff Present Heath Lark, RN, BSN, CCRP;Jessica Luan Pulling, MA, ACSM RCEP, Exercise Physiologist;Carroll Enterkin, RN, BSN   Supervising physician immediately available to respond to emergencies See telemetry face sheet for immediately available ER MD   Medication changes reported     No   Fall or balance concerns reported    No   Warm-up and Cool-down Performed on first and last piece of equipment   Resistance Training Performed Yes   VAD Patient? No     Pain Assessment   Currently in Pain? No/denies   Multiple Pain Sites No         History  Smoking Status  . Former Smoker  . Quit date: 09/23/1996  Smokeless Tobacco  . Not on file    Goals Met:  Exercise tolerated well Personal goals reviewed No report of cardiac concerns or symptoms Strength training completed today  Goals Unmet:  Not Applicable  Comments: Doing well with exercise prescription progression.    Dr. Emily Filbert is Medical Director for [place] and [facility].

## 2016-12-27 NOTE — Therapy (Signed)
[facility] MAIN [facility] SERVICES [address] [place], [place], [address] Phone: [phone]   Fax:  [phone]  Physical Therapy Evaluation  Patient Details  Name: Jeremy Mason MRN: [id number] Date of Birth: 03/30/1941 Referring Provider: Cristopher Peru MD  Encounter Date: 12/27/2016      PT End of Session - 12/27/16 1127    Visit Number 1   Number of Visits 12   Date for PT Re-Evaluation 02/07/17   Authorization Type 1 / 10 G code   PT Start Time 0900   PT Stop Time 1000   PT Time Calculation (min) 60 min   Equipment Utilized During Treatment Gait belt   Activity Tolerance Patient tolerated treatment well   Behavior During Therapy [facility] for tasks assessed/performed      Past Medical History:  Diagnosis Date  . Aortic aneurysm (HCC)   . Aortic stenosis   . Arthritis    hips, legs  . Atrial fibrillation (HCC)   . Barrett's esophagus   . Celiac disease   . COPD (chronic obstructive pulmonary disease) (HCC)   . GERD (gastroesophageal reflux disease)   . Heart murmur   . History of hiatal hernia   . Hypertension   . PONV (postoperative nausea and vomiting)    nausea after cataract  . Sleep apnea    no CPAP  . Vertigo    no episodes - 2-3 yrs    Past Surgical History:  Procedure Laterality Date  . CARDIAC CATHETERIZATION    . CARDIAC ELECTROPHYSIOLOGY STUDY AND ABLATION    . CARDIOVERSION    . CATARACT EXTRACTION W/PHACO Right 03/13/2016   Procedure: CATARACT EXTRACTION PHACO AND INTRAOCULAR LENS PLACEMENT (IOC);  Surgeon: Lockie Mola, MD;  Location: [facility];  Service: Ophthalmology;  Laterality: Right;  sleep apnea - no CPAP  . CATARACT EXTRACTION W/PHACO Left 04/10/2016   Procedure: CATARACT EXTRACTION PHACO AND INTRAOCULAR LENS PLACEMENT (IOC) left eye;  Surgeon: Lockie Mola, MD;  Location: [facility];  Service: Ophthalmology;  Laterality: Left;  . COLONOSCOPY    . TUMOR EXCISION     hip  bone    There were no vitals filed for this visit.       Subjective Assessment - 12/27/16 0920    Subjective Patient reports increased balance difficulties with balance s/p CVA. Patient reports increased difficulties with walking, balancing on one leg (has to sit to wash feet), difficulty with walking long distances without geting tired. Patient reports memory difficulties in terms of short term loss. Reports he forgets where he forgets where he puts things down.     Pertinent History R sided Hip Surgery surgery in 1974 with lead to decreased activation of R hip musculature, Heart valve replacement   Limitations Lifting;Standing   Patient Stated Goals To imporve balance   Currently in Pain? No/denies   Multiple Pain Sites No            [facility] PT Assessment - 12/27/16 0915      Assessment   Medical Diagnosis Dizziness and balance   Referring Provider Cristopher Peru MD   Onset Date/Surgical Date 05/24/16   Hand Dominance Right   Next MD Visit unknown   Prior Therapy none     Precautions   Precautions Fall     Balance Screen   Has the patient fallen in the past 6 months No   Has the patient had a decrease in activity level because of a fear of falling?  Yes   Is the patient reluctant to leave their home because of a fear of falling?  No     Home Environment   Living Environment Private residence   Living Arrangements Spouse/significant other   Available Help at Discharge Family   Type of Home House   Home Access Stairs to enter   Entrance Stairs-Number of Steps 3   Entrance Stairs-Rails Left   Home Layout One level   Home Equipment None     Prior Function   Level of Independence Independent   Vocation Retired   Gaffer N/A   Leisure tractor pull      Observation/Other Assessments   Other Surveys  Other Surveys   Dizziness Handicap Inventory (DHI)  54     ROM / Strength   AROM / PROM / Strength Strength     Strength   Strength Assessment Site  Hip;Knee;Ankle   Right/Left Hip Right;Left   Right Hip Flexion 5/5   Right Hip ABduction 3+/5   Right Hip ADduction 5/5   Left Hip Flexion 5/5   Left Hip ABduction 5/5   Left Hip ADduction 5/5   Right/Left Knee Right;Left   Right Knee Flexion 4-/5   Right Knee Extension 5/5   Left Knee Flexion 4-/5   Left Knee Extension 5/5   Right/Left Ankle Right;Left   Right Ankle Dorsiflexion 4+/5   Right Ankle Plantar Flexion 4/5   Left Ankle Dorsiflexion 4+/5   Left Ankle Plantar Flexion 4/5     Ambulation/Gait   Gait Pattern Step-through pattern   Gait velocity 1.38m/s   Gait Comments Slight deviation in path with performing amb with head turns     Standardized Balance Assessment   Standardized Balance Assessment Dynamic Gait Index;Timed Up and Go Test;Five Times Sit to Stand;10 meter walk test;Vestibular Evaluation   Five times sit to stand comments  8sec   10 Meter Walk 1.85 m/s     Dynamic Gait Index   Level Surface Normal   Change in Gait Speed Normal   Gait with Horizontal Head Turns Mild Impairment   Gait with Vertical Head Turns Normal   Gait and Pivot Turn Normal   Step Over Obstacle Normal   Step Around Obstacles Normal   Steps Normal   Total Score 23     Timed Up and Go Test   Normal TUG (seconds) 5.6      Observation: Eye pursuits Up/down; left/right: Delayed eye pursuits saccadic movement  (most notably on R eye versus L) - Reproduced patient's dizziness VOR: R/L: saccacid movement B eyes; U/D: WNL for saccadic movement (Both movements reproduced dizziness)  Functional Balance tests:  Single leg stance: 8 sec B; Tandem amb 7 foot step outs required to amb 65ft  Treatment: Therapeutic Exercise: SLS B with intermittent UE support -- x 30sec Clamshells on the R side -- x 10  Sidelying hip abduction -- x 5 (stopped due to fatigue)         PT Education - 12/27/16 1125    Education provided Yes   Education Details POC, Prognosis, Expectations for therapy    Person(s) Educated Patient   Methods Explanation   Comprehension Verbalized understanding             PT Long Term Goals - 12/27/16 1135      PT LONG TERM GOAL #1   Title Patient will be independent with HEP to demonstrate improvement after discharge from physical therapy   Baseline Dependent in form/technique  Time 6   Period Weeks   Status New     PT LONG TERM GOAL #2   Title Patient will score a 24/24 on the DGI to demonstrate significant improvement in gait stability when ambulating   Baseline 23/24 DGI   Time 6   Period Weeks   Status New     PT LONG TERM GOAL #3   Title Patient will be able to walk in tandem for 45ft to demonstrate significant improvement in dynamic balance and improved standing balance.   Baseline Patient steps out for support 7 times during test   Time 6   Period Weeks   Status New     PT LONG TERM GOAL #4   Title Patient will be able to perform SLS for >10 sec to demonstrate significant improvement in static balance and ability to wash his LE's in the shower.    Baseline 8 secs B SLS   Time 6   Period Weeks   Status New     PT LONG TERM GOAL #5   Title Patient will score <20 on the DHI to demonstrate significant improvement in dizziness   Baseline DHI: 54   Time 6   Period Weeks   Status New               Plan - Jan 21, 2017 1127    Clinical Impression Statement Patient is a 76 yo right hand dominant male presenting with increased balance difficulties secondary to a CVA. Patient presents with increased balance dysfunction as indicated by decreased DGI scores, dizziness with eye pursuits, and decreased VOR in sitting. Patient also demonstrates decreased muscular strength, coordination, and endurance and will benefit from further skilled therapy to return to prior level of function.     Rehab Potential Fair   Clinical Impairments Affecting Rehab Potential (+) highly motivated, faimly support (-) Symptoms evolving, >3 coormorbitites    PT Frequency 2x / week   PT Duration 6 weeks   PT Treatment/Interventions ADLs/Self Care Home Management;Gait training;Stair training;Functional mobility training;Therapeutic activities;Balance training;Therapeutic exercise;Patient/family education;Neuromuscular re-education;Canalith Repostioning;Energy conservation   PT Next Visit Plan Habituation interventions for dizziness   Consulted and Agree with Plan of Care Patient      Patient will benefit from skilled therapeutic intervention in order to improve the following deficits and impairments:  Abnormal gait, Decreased balance, Decreased activity tolerance, Decreased endurance, Decreased strength, Decreased coordination, Difficulty walking, Decreased mobility, Dizziness, Increased fascial restricitons  Visit Diagnosis: Unsteadiness on feet  Unspecified lack of coordination  Muscle weakness (generalized)      G-Codes - 01/21/2017 1142    Functional Assessment Tool Used (Outpatient Only) DGI, DHI, SLS, Tandem Amb, clinical judgement   Functional Limitation Mobility: Walking and moving around   Mobility: Walking and Moving Around Current Status [phone]) At least 1 percent but less than 20 percent impaired, limited or restricted   Mobility: Walking and Moving Around Goal Status [phone]) At least 1 percent but less than 20 percent impaired, limited or restricted       Problem List Patient Active Problem List   Diagnosis Date Noted  . Barrett esophagus 11/28/2016  . Celiac disease 11/28/2016  . GERD (gastroesophageal reflux disease) 11/28/2016  . Sleep apnea 11/28/2016  . History of aortic valve replacement 09/25/2016  . Palpitations 09/09/2016  . Encounter for management of temporary pacemaker 08/29/2016  . Metabolic acidosis 08/29/2016  . S/P aortic valve replacement with bioprosthetic valve 08/29/2016  . Leg length discrepancy 04/06/2015  . Tendinopathy of left  gluteus medius 02/25/2015  . Left hip pain 12/22/2014  . Spondylosis  of lumbar region without myelopathy or radiculopathy 12/22/2014  . COPD with emphysema (HCC) 12/09/2014  . Stenosis of lumbosacral spine 11/10/2014  . 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 AS (aortic stenosis) 09/22/2013  . Bursitis of left hip 02/02/2013  . Atherosclerosis of native artery of extremity with intermittent claudication (HCC) 12/28/2012  . Facet arthritis of lumbar region (HCC) 12/01/2012  . DDD (degenerative disc disease), lumbar 11/23/2012  . Low back pain 11/23/2012  . Dyslipidemia 04/09/2012  . Hypertension 04/09/2012  . Paroxysmal atrial fibrillation (HCC) 04/09/2012  . Pulmonary nodule 12/05/2011    Myrene Galas, PT DPT 12/27/2016, 11:49 AM  [facility] MAIN [facility] SERVICES [address] [place], [place], [address] Phone: [phone]   Fax:  [phone]  Name: Jeremy Mason MRN: [id number] Date of Birth: 1941-09-13

## 2016-12-30 ENCOUNTER — Encounter: Payer: Medicare Other | Admitting: *Deleted

## 2016-12-30 DIAGNOSIS — Z952 Presence of prosthetic heart valve: Secondary | ICD-10-CM | POA: Diagnosis not present

## 2016-12-30 NOTE — Progress Notes (Signed)
Daily Session Note  Patient Details  Name: JARIAN LONGORIA MRN: [id number] Date of Birth: [birth year] Referring Provider:     Cardiac Rehab from 11/28/2016 in [facility] Cardiac and Pulmonary Rehab  Referring Provider  Gaca      Encounter Date: 12/30/2016  Check In:     Session Check In - 12/30/16 0800      Check-In   Location [facility]-Cardiac & Pulmonary Rehab   Staff Present Gerlene Burdock, RN, Moises Blood, BS, ACSM CEP, Exercise Physiologist;Rainie Crenshaw Luan Pulling, [place], ACSM RCEP, Exercise Physiologist   Supervising physician immediately available to respond to emergencies See telemetry face sheet for immediately available ER MD   Medication changes reported     No   Fall or balance concerns reported    No   Warm-up and Cool-down Performed on first and last piece of equipment   Resistance Training Performed Yes   VAD Patient? No     Pain Assessment   Currently in Pain? No/denies   Multiple Pain Sites No         History  Smoking Status  . Former Smoker  . Quit date: 09/23/1996  Smokeless Tobacco  . Not on file    Goals Met:  Independence with exercise equipment Exercise tolerated well No report of cardiac concerns or symptoms Strength training completed today  Goals Unmet:  Not Applicable  Comments: Pt able to follow exercise prescription today without complaint.  Will continue to monitor for progression.    Dr. Emily Filbert is Medical Director for [place] and [facility].

## 2017-01-02 ENCOUNTER — Ambulatory Visit: Payer: Medicare Other

## 2017-01-02 DIAGNOSIS — R2681 Unsteadiness on feet: Secondary | ICD-10-CM

## 2017-01-02 DIAGNOSIS — M6281 Muscle weakness (generalized): Secondary | ICD-10-CM

## 2017-01-02 DIAGNOSIS — R279 Unspecified lack of coordination: Secondary | ICD-10-CM

## 2017-01-02 NOTE — Therapy (Signed)
[facility] MAIN [facility] SERVICES [address] [place], [place], [address] Phone: [phone]   Fax:  [phone]  Physical Therapy Treatment  Patient Details  Name: Jeremy Mason MRN: [id number] Date of Birth: 07/05/1941 Referring Provider: Cristopher Peru MD  Encounter Date: 01/02/2017      PT End of Session - 01/02/17 1530    Visit Number 2   Number of Visits 12   Date for PT Re-Evaluation 02/07/17   Authorization Type 2 / 10 G code   PT Start Time 1430   PT Stop Time 1515   PT Time Calculation (min) 45 min   Equipment Utilized During Treatment Gait belt   Activity Tolerance Patient tolerated treatment well   Behavior During Therapy [facility] for tasks assessed/performed      Past Medical History:  Diagnosis Date  . Aortic aneurysm (HCC)   . Aortic stenosis   . Arthritis    hips, legs  . Atrial fibrillation (HCC)   . Barrett's esophagus   . Celiac disease   . COPD (chronic obstructive pulmonary disease) (HCC)   . GERD (gastroesophageal reflux disease)   . Heart murmur   . History of hiatal hernia   . Hypertension   . PONV (postoperative nausea and vomiting)    nausea after cataract  . Sleep apnea    no CPAP  . Vertigo    no episodes - 2-3 yrs    Past Surgical History:  Procedure Laterality Date  . CARDIAC CATHETERIZATION    . CARDIAC ELECTROPHYSIOLOGY STUDY AND ABLATION    . CARDIOVERSION    . CATARACT EXTRACTION W/PHACO Right 03/13/2016   Procedure: CATARACT EXTRACTION PHACO AND INTRAOCULAR LENS PLACEMENT (IOC);  Surgeon: Lockie Mola, MD;  Location: [facility];  Service: Ophthalmology;  Laterality: Right;  sleep apnea - no CPAP  . CATARACT EXTRACTION W/PHACO Left 04/10/2016   Procedure: CATARACT EXTRACTION PHACO AND INTRAOCULAR LENS PLACEMENT (IOC) left eye;  Surgeon: Lockie Mola, MD;  Location: [facility];  Service: Ophthalmology;  Laterality: Left;  . COLONOSCOPY    . TUMOR EXCISION     hip  bone    There were no vitals filed for this visit.      Subjective Assessment - 01/02/17 1438    Subjective Patient reports no major changes since the previous visit. Patient reports his symptoms have been better the past few days.    Pertinent History R sided Hip Surgery surgery in 1974 with lead to decreased activation of R hip musculature, Heart valve replacement   Limitations Lifting;Standing   Patient Stated Goals To imporve balance   Currently in Pain? No/denies      TREATMENT:  Tandem ambulation - in  bars down and back --  x8 times Horizontal turning with the ball and head following - 6 x 64ft  Ball throws with ambulation with following the head - 6 x 36ft Horizontal head turns with holding the ball straight out in front - 6 x 34ft Up/down head turns with holding the ball straight out in front - x 20 repetitions  Dribbling ball forward/backward - 4 x 80ft  Walking down hall calling out numbers on both walls - 49ft x 2  Seated on physioball head turns L/R, U/D with eyes fixated on object - x15  Ball grabbing while ambulation with trunk rotation - 2 x 50ft       PT Education - 01/02/17 1530    Education provided Yes   Education Details  FOrm/technique with exercise   Person(s) Educated Patient   Methods Explanation;Demonstration   Comprehension Verbalized understanding;Returned demonstration             PT Long Term Goals - 12/27/16 1135      PT LONG TERM GOAL #1   Title Patient will be independent with HEP to demonstrate improvement after discharge from physical therapy   Baseline Dependent in form/technique   Time 6   Period Weeks   Status New     PT LONG TERM GOAL #2   Title Patient will score a 24/24 on the DGI to demonstrate significant improvement in gait stability when ambulating   Baseline 23/24 DGI   Time 6   Period Weeks   Status New     PT LONG TERM GOAL #3   Title Patient will be able to walk in tandem for 97ft to demonstrate significant  improvement in dynamic balance and improved standing balance.   Baseline Patient steps out for support 7 times during test   Time 6   Period Weeks   Status New     PT LONG TERM GOAL #4   Title Patient will be able to perform SLS for >10 sec to demonstrate significant improvement in static balance and ability to wash his LE's in the shower.    Baseline 8 secs B SLS   Time 6   Period Weeks   Status New     PT LONG TERM GOAL #5   Title Patient will score <20 on the DHI to demonstrate significant improvement in dizziness   Baseline DHI: 54   Time 6   Period Weeks   Status New               Plan - 01/02/17 1531    Clinical Impression Statement Patient demosntrates increased dizziness when performing end range head turns with eyes fixated on an object indicating vestibular and central nervous system dysfunction. Patinet demonstrates less dizziness after performing activities multiple times indicating habitulation and patient will benefit from further skilled therapy to return to prior level of function.    Rehab Potential Fair   Clinical Impairments Affecting Rehab Potential (+) highly motivated, faimly support (-) Symptoms evolving, >3 coormorbitites   PT Frequency 2x / week   PT Duration 6 weeks   PT Treatment/Interventions ADLs/Self Care Home Management;Gait training;Stair training;Functional mobility training;Therapeutic activities;Balance training;Therapeutic exercise;Patient/family education;Neuromuscular re-education;Canalith Repostioning;Energy conservation   PT Next Visit Plan Habituation interventions for dizziness   Consulted and Agree with Plan of Care Patient      Patient will benefit from skilled therapeutic intervention in order to improve the following deficits and impairments:  Abnormal gait, Decreased balance, Decreased activity tolerance, Decreased endurance, Decreased strength, Decreased coordination, Difficulty walking, Decreased mobility, Dizziness, Increased  fascial restricitons  Visit Diagnosis: Unsteadiness on feet  Unspecified lack of coordination  Muscle weakness (generalized)     Problem List Patient Active Problem List   Diagnosis Date Noted  . Barrett esophagus 11/28/2016  . Celiac disease 11/28/2016  . GERD (gastroesophageal reflux disease) 11/28/2016  . Sleep apnea 11/28/2016  . History of aortic valve replacement 09/25/2016  . Palpitations 09/09/2016  . Encounter for management of temporary pacemaker 08/29/2016  . Metabolic acidosis 08/29/2016  . S/P aortic valve replacement with bioprosthetic valve 08/29/2016  . Leg length discrepancy 04/06/2015  . Tendinopathy of left gluteus medius 02/25/2015  . Left hip pain 12/22/2014  . Spondylosis of lumbar region without myelopathy or radiculopathy 12/22/2014  . COPD  with emphysema (HCC) 12/09/2014  . Stenosis of lumbosacral spine 11/10/2014  . 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 AS (aortic stenosis) 09/22/2013  . Bursitis of left hip 02/02/2013  . Atherosclerosis of native artery of extremity with intermittent claudication (HCC) 12/28/2012  . Facet arthritis of lumbar region (HCC) 12/01/2012  . DDD (degenerative disc disease), lumbar 11/23/2012  . Low back pain 11/23/2012  . Dyslipidemia 04/09/2012  . Hypertension 04/09/2012  . Paroxysmal atrial fibrillation (HCC) 04/09/2012  . Pulmonary nodule 12/05/2011    Myrene Galas, PT DPT 01/02/2017, 3:35 PM  [facility] MAIN [facility] SERVICES [address] [place], [place], [address] Phone: [phone]   Fax:  [phone]  Name: Jeremy Mason MRN: [id number] Date of Birth: 1941-01-16

## 2017-01-06 ENCOUNTER — Encounter: Payer: Medicare Other | Admitting: *Deleted

## 2017-01-06 DIAGNOSIS — Z952 Presence of prosthetic heart valve: Secondary | ICD-10-CM

## 2017-01-06 NOTE — Progress Notes (Signed)
Daily Session Note  Patient Details  Name: KARIM AIELLO MRN: [id number] Date of Birth: [birth year] Referring Provider:     Cardiac Rehab from 11/28/2016 in [facility]  Referring Provider  [place]      Encounter Date: 01/06/2017  Check In:     Session Check In - 01/06/17 0810      Check-In   Location [facility]-Cardiac & Pulmonary Rehab   Staff Present Alberteen Sam, MA, ACSM RCEP, Exercise Physiologist;Kelly Amedeo Plenty, BS, ACSM CEP, Exercise Physiologist;Carroll Enterkin, RN, BSN   Supervising physician immediately available to respond to emergencies See telemetry face sheet for immediately available ER MD   Medication changes reported     No   Fall or balance concerns reported    No   Warm-up and Cool-down Performed on first and last piece of equipment   Resistance Training Performed Yes   VAD Patient? No     Pain Assessment   Currently in Pain? No/denies   Multiple Pain Sites No         History  Smoking Status  . Former Smoker  . Quit date: 09/23/1996  Smokeless Tobacco  . Not on file    Goals Met:  Independence with exercise equipment Exercise tolerated well No report of cardiac concerns or symptoms Strength training completed today  Goals Unmet:  Not Applicable  Comments: Pt able to follow exercise prescription today without complaint.  Will continue to monitor for progression.    Dr. Emily Filbert is Medical Director for [facility].

## 2017-01-08 ENCOUNTER — Encounter: Payer: Self-pay | Admitting: Physical Therapy

## 2017-01-08 ENCOUNTER — Ambulatory Visit: Payer: Medicare Other | Admitting: Physical Therapy

## 2017-01-08 VITALS — Ht 72.25 in | Wt 206.0 lb

## 2017-01-08 DIAGNOSIS — Z952 Presence of prosthetic heart valve: Secondary | ICD-10-CM

## 2017-01-08 DIAGNOSIS — R2681 Unsteadiness on feet: Secondary | ICD-10-CM

## 2017-01-08 DIAGNOSIS — M6281 Muscle weakness (generalized): Secondary | ICD-10-CM

## 2017-01-08 DIAGNOSIS — R279 Unspecified lack of coordination: Secondary | ICD-10-CM

## 2017-01-08 NOTE — Patient Instructions (Signed)
  Extensors, Supine   Lie supine, head on small, rolled towel. Gently tuck chin and bring toward chest. Hold _5__ seconds. Repeat 10___ times per session. Do _2-3__ sessions per day.  Copyright  VHI. All rights reserved.  Flexors, Sitting / Standing   Stand or sit, head in comfortable, centered position. Draw chin in, pulling head straight back, keeping jaw and eyes level. Hold _5__ seconds. Repeat 10___ times per session. Do _2-3__ sessions per day.   Copyright  VHI. All rights reserved.   Roll   Inhale and bring shoulders up, back, then exhale and relax shoulders down. Repeat _10__ times. Do _2-3__ times per day.  Copyright  VHI. All rights reserved.

## 2017-01-08 NOTE — Progress Notes (Signed)
Daily Session Note  Patient Details  Name: Jeremy Mason MRN: [id number] Date of Birth: 1941/08/25 Referring Provider:     Cardiac Rehab from 11/28/2016 in [facility] Cardiac and Pulmonary Rehab  Referring Provider  [place]aca      Encounter Date: 01/08/2017  Check In:     Session Check In - 01/08/17 0832      Check-In   Location [facility]-Cardiac & Pulmonary Rehab   Staff Present Heath Lark, RN, BSN, CCRP;Kamau Weatherall, BA, ACSM CEP, Exercise Physiologist;Jessica [place], [place], ACSM RCEP, Exercise Physiologist   Supervising physician immediately available to respond to emergencies See telemetry face sheet for immediately available ER MD   Medication changes reported     No   Fall or balance concerns reported    No   Warm-up and Cool-down Performed on first and last piece of equipment   Resistance Training Performed Yes   VAD Patient? No     Pain Assessment   Currently in Pain? No/denies   Multiple Pain Sites No         History  Smoking Status  . Former Smoker  . Quit date: 09/23/1996  Smokeless Tobacco  . Never Used    Goals Met:  Independence with exercise equipment Exercise tolerated well Strength training completed today  Goals Unmet:  Not Applicable  Comments:      [place] Name 11/28/16 1528 01/08/17 1141       6 Minute Walk   Phase  - Discharge    Distance 1404 feet 1648 feet    Walk Time 6 minutes 6 minutes    # of Rest Breaks 0 0    MPH 2.65 3.12    METS 2.83 3.56    RPE 13 12    VO2 Peak 9.9 12.48    Symptoms Yes (comment)  -    Comments Hip pain due to muscle atrophy from suregry 2 years ago  -    Resting HR 72 bpm 60 bpm    Resting BP 132/64 158/78    Max Ex. HR 97 bpm 98 bpm    Max Ex. BP 158/62 178/76         Dr. Emily Filbert is Medical Director for [facility].

## 2017-01-08 NOTE — Patient Instructions (Signed)
Discharge Progress Report   Patient Details  Name: Jeremy Mason MRN: [id number] Date of Birth: [birth year] Referring Provider:  Philip Aspen, MD   Number of Visits: 36/36  Reason for Discharge:  Patient reached a stable level of exercise. Patient independent in their exercise.  Smoking History:  History  Smoking Status  . Former Smoker  . Quit date: 09/23/1996  Smokeless Tobacco  . Never Used    Diagnosis:  S/P aortic valve replacement  Initial Exercise Prescription:     Initial Exercise Prescription - 11/28/16 1500      Date of Initial Exercise RX and Referring Provider   Date 11/28/16   Referring Provider Gaca     Treadmill   MPH 2   Grade 0   Minutes 6  6/6     Recumbant Bike   Level 1   RPM 60   Minutes 15   METs 2.5     NuStep   Level 3   SPM 80   Minutes 15   METs 2.5     T5 Nustep   Level 2   SPM 60   Minutes 15   METs 2.5     Prescription Details   Frequency (times per week) 3   Duration Progress to 45 minutes of aerobic exercise without signs/symptoms of physical distress     Intensity   THRR 40-80% of Max Heartrate 100-129   Ratings of Perceived Exertion 11-15   Perceived Dyspnea 0-4     Resistance Training   Training Prescription Yes   Weight 3   Reps 10-15      Discharge Exercise Prescription (Final Exercise Prescription Changes):     Exercise Prescription Changes - 12/25/16 1500      Response to Exercise   Blood Pressure (Admit) 126/54   Blood Pressure (Exercise) 146/70   Blood Pressure (Exit) 128/66   Heart Rate (Admit) 80 bpm   Heart Rate (Exercise) 119 bpm   Heart Rate (Exit) 63 bpm   Rating of Perceived Exertion (Exercise) 13   Symptoms none   Comments transfer from [facility]   Duration Continue with 45 min of aerobic exercise without signs/symptoms of physical distress.   Intensity THRR unchanged     Progression   Progression Continue to progress workloads to maintain intensity without signs/symptoms  of physical distress.   Average METs 4.65     Resistance Training   Training Prescription Yes   Weight 5 lbs   Reps 10-15     Interval Training   Interval Training Yes   Equipment T5 Nustep;Recumbant Bike;REL-XR   Comments 1 min on 3 min off     NuStep   Level 7   SPM 100   Minutes 15   METs 4.7     REL-XR   Level 2   Minutes 15   METs 4.6     Home Exercise Plan   Plans to continue exercise at Home (comment)  chair and hand weight exercises   Frequency Add 2 additional days to program exercise sessions.   Initial Home Exercises Provided 12/18/16      Functional Capacity:     6 Minute Walk    [place] Name 11/28/16 1528 01/08/17 1141       6 Minute Walk   Phase  - Discharge    Distance 1404 feet 1648 feet    Walk Time 6 minutes 6 minutes    # of Rest Breaks 0 0    MPH 2.65  3.12    METS 2.83 3.56    RPE 13 12    VO2 Peak 9.9 12.48    Symptoms Yes (comment)  -    Comments Hip pain due to muscle atrophy from suregry 2 years ago  -    Resting HR 72 bpm 60 bpm    Resting BP 132/64 158/78    Max Ex. HR 97 bpm 98 bpm    Max Ex. BP 158/62 178/76       Quality of Life:     Quality of Life - 12/25/16 0908      Quality of Life Scores   Health/Function Pre 22.4 %   Health/Function Post 21.57 %   Health/Function % Change -3.71 %   Socioeconomic Pre 24 %   Socioeconomic Post 26.57 %   Socioeconomic % Change  10.71 %   Psych/Spiritual Pre 24.86 %   Psych/Spiritual Post 24.86 %   Psych/Spiritual % Change 0 %   Family Pre 21.6 %   Family Post 22.8 %   Family % Change 5.56 %   GLOBAL Pre 23.09 %   GLOBAL Post 23.46 %   GLOBAL % Change 1.6 %      Personal Goals: Goals established at orientation with interventions provided to work toward goal.     Personal Goals and Risk Factors at Admission - 12/27/16 0801      Core Components/Risk Factors/Patient Goals on Admission   Admit Weight 209 lb 9.6 oz (95.1 kg)   Goal Weight: Short Term 204 lb (92.5 kg)    Goal Weight: Long Term 195 lb (88.5 kg)       Personal Goals Discharge:     Goals and Risk Factor Review - 12/27/16 0802      Core Components/Risk Factors/Patient Goals Review   Personal Goals Review Hypertension;Lipids   Review Kendrik states he has lost 4 pounds since starting, he contnues to "cut back" as he was advised by the RD at [facility] before he transferred to this program. He is compliant with his medications and is enjoying the exercise.   Expected Outcomes Short and Long: Continue to work on his weight and attend rehab to work on risk factor modification.  Continue with nutrition plan for weight loss.      Nutrition & Weight - Outcomes:     Pre Biometrics - 11/28/16 1527      Pre Biometrics   Height 6' 0.25" (1.835 m)   Weight 209 lb 9.6 oz (95.1 kg)   Waist Circumference 40.75 inches   Hip Circumference 43 inches   Waist to Hip Ratio 0.95 %   BMI (Calculated) 28.3   Single Leg Stand 22.41 seconds         Post Biometrics - 01/08/17 1140       Post  Biometrics   Height 6' 0.25" (1.835 m)   Weight 206 lb (93.4 kg)   Waist Circumference 39.5 inches   Hip Circumference 42 inches   Waist to Hip Ratio 0.94 %   BMI (Calculated) 27.8   Single Leg Stand 27.46 seconds      Nutrition:     Nutrition Therapy & Goals - 12/27/16 0820      Nutrition Therapy   Diet RD visit remains deferred  Saw RD at other Cardiac Rehab program before he transferred here.      Nutrition Discharge:     Nutrition Assessments - 12/25/16 0908      MEDFICTS Scores  Pre Score 134   Post Score 84   Score Difference -50      Education Questionnaire Score:     Knowledge Questionnaire Score - 12/25/16 0908      Knowledge Questionnaire Score   Pre Score 21/28   Post Score 25/28      Goals reviewed with patient; copy given to patient.

## 2017-01-08 NOTE — Therapy (Signed)
[facility] MAIN [facility] SERVICES [address] [place], [place], [address] Phone: [phone]   Fax:  [phone]  Physical Therapy Treatment  Patient Details  Name: Jeremy Mason MRN: [id number] Date of Birth: 17-Sep-1941 Referring Provider: Cristopher Peru MD  Encounter Date: 01/08/2017      PT End of Session - 01/08/17 0959    Visit Number 3   Number of Visits 12   Date for PT Re-Evaluation 02/07/17   Authorization Type 3/10 G code   PT Start Time 0946   PT Stop Time 1030   PT Time Calculation (min) 44 min   Equipment Utilized During Treatment Gait belt   Activity Tolerance Patient tolerated treatment well   Behavior During Therapy [facility] for tasks assessed/performed      Past Medical History:  Diagnosis Date  . Aortic aneurysm (HCC)   . Aortic stenosis   . Arthritis    hips, legs  . Atrial fibrillation (HCC)   . Barrett's esophagus   . Celiac disease   . COPD (chronic obstructive pulmonary disease) (HCC)   . GERD (gastroesophageal reflux disease)   . Heart murmur   . History of hiatal hernia   . Hypertension   . PONV (postoperative nausea and vomiting)    nausea after cataract  . Sleep apnea    no CPAP  . Vertigo    no episodes - 2-3 yrs    Past Surgical History:  Procedure Laterality Date  . CARDIAC CATHETERIZATION    . CARDIAC ELECTROPHYSIOLOGY STUDY AND ABLATION    . CARDIOVERSION    . CATARACT EXTRACTION W/PHACO Right 03/13/2016   Procedure: CATARACT EXTRACTION PHACO AND INTRAOCULAR LENS PLACEMENT (IOC);  Surgeon: Lockie Mola, MD;  Location: [facility];  Service: Ophthalmology;  Laterality: Right;  sleep apnea - no CPAP  . CATARACT EXTRACTION W/PHACO Left 04/10/2016   Procedure: CATARACT EXTRACTION PHACO AND INTRAOCULAR LENS PLACEMENT (IOC) left eye;  Surgeon: Lockie Mola, MD;  Location: [facility];  Service: Ophthalmology;  Laterality: Left;  . COLONOSCOPY    . TUMOR EXCISION     hip  bone    There were no vitals filed for this visit.      Subjective Assessment - 01/08/17 0951    Subjective Patient reports doing well; He reports having normal aches and pains but nothing significant; He reports just finishing heart track; He does continue to fatigue quickly; He reports mild dizziness at rest with wooziness at rest; He reports "I feel like some things in my vision are moving, not spinning but moving" He reports that this is accompanied by a headache which comes and goes; He reports that his headache is mostly in the back and along right side, today its through the whole head;    Pertinent History R sided Hip Surgery surgery in 1974 with lead to decreased activation of R hip musculature, Heart valve replacement   Limitations Lifting;Standing   Patient Stated Goals To imporve balance   Currently in Pain? Yes   Pain Score 5    Pain Location Head   Pain Descriptors / Indicators Heaviness;Headache;Constant   Pain Type Acute pain   Pain Onset Today   Pain Frequency Intermittent   Aggravating Factors  came on during heart track   Pain Relieving Factors meds help   Effect of Pain on Daily Activities increased dizziness, limited mobility;        TREATMENT:  Patient in sitting, VOR exercise 1 and 2, vertical and  horizontal x10 with cues for correct technique; Patient demonstrates increased eye jumping and difficulty with horizontal VOR exercise. He reports increased symptoms following 1st set of 10;   Patient in supine: moist heat to cervical paraspinals x5 min (unbilled to reduce headache/symptoms)  Patient supine: Suboccipital release x1 min, patient exhibits increased tightness in cervical paraspinals; He reports less headache and less symptoms with suboccipital release; Advanced HEP with chin tucks/shoulder roll to improve stretch to cervical spine;   Instructed patient in VOR exercise 1 and 2, in standing on airex x10 reps with cues for exercise technique and for  positioning; Demonstrates increased unsteadiness but denies any increase in dizziness.  Modified tandem stance on airex, eyes open/closed 10 sec hold x3 each with cues for gaze stabilization when eyes are open and to improve upper trunk control when eyes are closed; able to demonstrate better stance control with increased repetition when eyes closed. Progressed from Min A for balance to CGA.  Modified tandem stance on airex: eyes open, focus on target, head turns side/side x5 with cues for gaze stabilization, then eyes closed head turns side/side x5 with CGA for balance control; Patient reports continued dizziness but not increased from start of session;  Provided written HEP for compliance;                           PT Education - 01/08/17 0954    Education provided Yes   Education Details HEP reinforced, exercise technique;    Person(s) Educated Patient   Methods Explanation;Demonstration;Verbal cues   Comprehension Verbalized understanding;Returned demonstration;Verbal cues required             PT Long Term Goals - 12/27/16 1135      PT LONG TERM GOAL #1   Title Patient will be independent with HEP to demonstrate improvement after discharge from physical therapy   Baseline Dependent in form/technique   Time 6   Period Weeks   Status New     PT LONG TERM GOAL #2   Title Patient will score a 24/24 on the DGI to demonstrate significant improvement in gait stability when ambulating   Baseline 23/24 DGI   Time 6   Period Weeks   Status New     PT LONG TERM GOAL #3   Title Patient will be able to walk in tandem for 43ft to demonstrate significant improvement in dynamic balance and improved standing balance.   Baseline Patient steps out for support 7 times during test   Time 6   Period Weeks   Status New     PT LONG TERM GOAL #4   Title Patient will be able to perform SLS for >10 sec to demonstrate significant improvement in static balance and ability to  wash his LE's in the shower.    Baseline 8 secs B SLS   Time 6   Period Weeks   Status New     PT LONG TERM GOAL #5   Title Patient will score <20 on the DHI to demonstrate significant improvement in dizziness   Baseline DHI: 54   Time 6   Period Weeks   Status New               Plan - 01/08/17 1005    Clinical Impression Statement Patient reports increased dizziness today and increased headache. He reports compliance with HEP with VOR exercise, but reports that sometimes he forgets or he doesn't do all of them. Patient  instructed in VOR exercise with reproduction of symptoms especially with horizontal eye movement. Patient requires cues for correct exercise technique to improve gaze stabilization and eye movement for better balance control. He would benefit from additional skilled PT intervention to improve balance and reduce dizziness.    Rehab Potential Fair   Clinical Impairments Affecting Rehab Potential (+) highly motivated, faimly support (-) Symptoms evolving, >3 coormorbitites   PT Frequency 2x / week   PT Duration 6 weeks   PT Treatment/Interventions ADLs/Self Care Home Management;Gait training;Stair training;Functional mobility training;Therapeutic activities;Balance training;Therapeutic exercise;Patient/family education;Neuromuscular re-education;Canalith Repostioning;Energy conservation   PT Next Visit Plan Habituation interventions for dizziness   Consulted and Agree with Plan of Care Patient      Patient will benefit from skilled therapeutic intervention in order to improve the following deficits and impairments:  Abnormal gait, Decreased balance, Decreased activity tolerance, Decreased endurance, Decreased strength, Decreased coordination, Difficulty walking, Decreased mobility, Dizziness, Increased fascial restricitons  Visit Diagnosis: Unsteadiness on feet  Unspecified lack of coordination  Muscle weakness (generalized)     Problem List Patient Active  Problem List   Diagnosis Date Noted  . Barrett esophagus 11/28/2016  . Celiac disease 11/28/2016  . GERD (gastroesophageal reflux disease) 11/28/2016  . Sleep apnea 11/28/2016  . History of aortic valve replacement 09/25/2016  . Palpitations 09/09/2016  . Encounter for management of temporary pacemaker 08/29/2016  . Metabolic acidosis 08/29/2016  . S/P aortic valve replacement with bioprosthetic valve 08/29/2016  . Leg length discrepancy 04/06/2015  . Tendinopathy of left gluteus medius 02/25/2015  . Left hip pain 12/22/2014  . Spondylosis of lumbar region without myelopathy or radiculopathy 12/22/2014  . COPD with emphysema (HCC) 12/09/2014  . Stenosis of lumbosacral spine 11/10/2014  . 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 AS (aortic stenosis) 09/22/2013  . Bursitis of left hip 02/02/2013  . Atherosclerosis of native artery of extremity with intermittent claudication (HCC) 12/28/2012  . Facet arthritis of lumbar region (HCC) 12/01/2012  . DDD (degenerative disc disease), lumbar 11/23/2012  . Low back pain 11/23/2012  . Dyslipidemia 04/09/2012  . Hypertension 04/09/2012  . Paroxysmal atrial fibrillation (HCC) 04/09/2012  . Pulmonary nodule 12/05/2011    Meliss Fleek PT, DPT 01/08/2017, 12:49 PM  [facility] MAIN [facility] SERVICES [address] [place], [place], [address] Phone: [phone]   Fax:  [phone]  Name: Jeremy Mason MRN: [id number] Date of Birth: 03/08/1941

## 2017-01-10 ENCOUNTER — Encounter: Payer: Medicare Other | Admitting: *Deleted

## 2017-01-10 ENCOUNTER — Ambulatory Visit: Payer: Medicare Other

## 2017-01-10 DIAGNOSIS — Z952 Presence of prosthetic heart valve: Secondary | ICD-10-CM | POA: Diagnosis not present

## 2017-01-10 NOTE — Progress Notes (Signed)
Cardiac Individual Treatment Plan  Patient Details  Name: NAGEE GOATES MRN: [id number] Date of Birth: Nov 25, 1940 Referring Provider:     Cardiac Rehab from 11/28/2016 in [facility] Cardiac and Pulmonary Rehab  Referring Provider  Gaca      Initial Encounter Date:    Cardiac Rehab from 11/28/2016 in [facility] Cardiac and Pulmonary Rehab  Date  11/28/16  Referring Provider  Gaca      Visit Diagnosis: S/P aortic valve replacement  Patient's Home Medications on Admission:  Current Outpatient Prescriptions:  .  amLODipine (NORVASC) 5 MG tablet, Take 5 mg by mouth daily., Disp: , Rfl:  .  ARTIFICIAL TEAR OP, Apply to eye., Disp: , Rfl:  .  aspirin 325 MG tablet, Take 325 mg by mouth daily., Disp: , Rfl:  .  butalbital-acetaminophen-caffeine (FIORICET WITH CODEINE) 50-325-40-30 MG capsule, Take 1 capsule by mouth every 4 (four) hours as needed for headache., Disp: , Rfl:  .  Cholecalciferol (VITAMIN D) 2000 units CAPS, Take by mouth daily., Disp: , Rfl:  .  clopidogrel (PLAVIX) 75 MG tablet, Take 75 mg by mouth daily., Disp: , Rfl:  .  Coenzyme Q10 (COQ10) 200 MG CAPS, Take by mouth daily., Disp: , Rfl:  .  dicyclomine (BENTYL) 10 MG capsule, Take 10 mg by mouth daily., Disp: , Rfl:  .  gabapentin (NEURONTIN) 100 MG capsule, Take 100 mg by mouth 3 (three) times daily., Disp: , Rfl:  .  metoprolol succinate (TOPROL-XL) 25 MG 24 hr tablet, Take 75 mg by mouth daily., Disp: , Rfl:  .  metoprolol tartrate (LOPRESSOR) 25 MG tablet, Take 12.5 mg by mouth daily., Disp: , Rfl:  .  Multiple Vitamins-Minerals (ICAPS AREDS 2 PO), Take 1 tablet by mouth daily., Disp: , Rfl:  .  olmesartan (BENICAR) 20 MG tablet, Take 20 mg by mouth daily., Disp: , Rfl:  .  pantoprazole (PROTONIX) 40 MG tablet, Take 40 mg by mouth daily., Disp: , Rfl:  .  potassium gluconate 595 (99 K) MG TABS tablet, Take 595 mg by mouth daily., Disp: , Rfl:  .  psyllium (METAMUCIL) 58.6 % powder, Take 1 packet by mouth daily., Disp: , Rfl:    Past Medical History: Past Medical History:  Diagnosis Date  . Aortic aneurysm ([place])   . Aortic stenosis   . Arthritis    hips, legs  . Atrial fibrillation ([place])   . Barrett's esophagus   . Celiac disease   . COPD (chronic obstructive pulmonary disease) ([place])   . GERD (gastroesophageal reflux disease)   . Heart murmur   . History of hiatal hernia   . Hypertension   . PONV (postoperative nausea and vomiting)    nausea after cataract  . Sleep apnea    no CPAP  . Vertigo    no episodes - 2-3 yrs    Tobacco Use: History  Smoking Status  . Former Smoker  . Quit date: 09/23/1996  Smokeless Tobacco  . Never Used    Labs: Recent Review Flowsheet Data    There is no flowsheet data to display.       Exercise Target Goals:    Exercise Program Goal: Individual exercise prescription set with THRR, safety & activity barriers. Participant demonstrates ability to understand and report RPE using BORG scale, to self-measure pulse accurately, and to acknowledge the importance of the exercise prescription.  Exercise Prescription Goal: Starting with aerobic activity 30 plus minutes a day, 3 days per week for initial exercise prescription. Provide  home exercise prescription and guidelines that participant acknowledges understanding prior to discharge.  Activity Barriers & Risk Stratification:   6 Minute Walk:     6 Minute Walk    Row Name 11/28/16 1528 01/08/17 1141       6 Minute Walk   Phase  - Discharge    Distance 1404 feet 1648 feet    Walk Time 6 minutes 6 minutes    # of Rest Breaks 0 0    MPH 2.65 3.12    METS 2.83 3.56    RPE 13 12    VO2 Peak 9.9 12.48    Symptoms Yes (comment)  -    Comments Hip pain due to muscle atrophy from suregry 2 years ago  -    Resting HR 72 bpm 60 bpm    Resting BP 132/64 158/78    Max Ex. HR 97 bpm 98 bpm    Max Ex. BP 158/62 178/76       Oxygen Initial Assessment:   Oxygen Re-Evaluation:   Oxygen Discharge (Final  Oxygen Re-Evaluation):   Initial Exercise Prescription:     Initial Exercise Prescription - 11/28/16 1500      Date of Initial Exercise RX and Referring Provider   Date 11/28/16   Referring Provider Gaca     Treadmill   MPH 2   Grade 0   Minutes 6  6/6     Recumbant Bike   Level 1   RPM 60   Minutes 15   METs 2.5     NuStep   Level 3   SPM 80   Minutes 15   METs 2.5     T5 Nustep   Level 2   SPM 60   Minutes 15   METs 2.5     Prescription Details   Frequency (times per week) 3   Duration Progress to 45 minutes of aerobic exercise without signs/symptoms of physical distress     Intensity   THRR 40-80% of Max Heartrate 100-129   Ratings of Perceived Exertion 11-15   Perceived Dyspnea 0-4     Resistance Training   Training Prescription Yes   Weight 3   Reps 10-15      Perform Capillary Blood Glucose checks as needed.  Exercise Prescription Changes:     Exercise Prescription Changes    Row Name 12/10/16 1500 12/18/16 0900 12/25/16 1500         Response to Exercise   Blood Pressure (Admit) 136/72  - 126/54     Blood Pressure (Exercise) 178/72  - 146/70     Blood Pressure (Exit) 120/70  - 128/66     Heart Rate (Admit) 66 bpm  - 80 bpm     Heart Rate (Exercise) 125 bpm  - 119 bpm     Heart Rate (Exit) 76 bpm  - 63 bpm     Rating of Perceived Exertion (Exercise) 12  - 13     Symptoms none none none     Comments transfer from [facility] transfer from [place] transfer from [facility]     Duration Continue with 45 min of aerobic exercise without signs/symptoms of physical distress. Continue with 45 min of aerobic exercise without signs/symptoms of physical distress. Continue with 45 min of aerobic exercise without signs/symptoms of physical distress.     Intensity THRR unchanged THRR unchanged THRR unchanged       Progression   Progression Continue to progress workloads to maintain intensity  without signs/symptoms of physical distress.  Continue to progress workloads to maintain intensity without signs/symptoms of physical distress. Continue to progress workloads to maintain intensity without signs/symptoms of physical distress.     Average METs 4.6 4.6 4.65       Resistance Training   Training Prescription Yes Yes Yes     Weight 3 lbs 3 lbs 5 lbs     Reps 10-15 10-15 10-15       Interval Training   Interval Training Yes Yes Yes     Equipment T5 Nustep;Recumbant Bike;REL-XR T5 Nustep;Recumbant Bike;REL-XR T5 Nustep;Recumbant Bike;REL-XR     Comments 1 min on 3 min off 1 min on 3 min off 1 min on 3 min off       Recumbant Bike   Level 1 1  -     RPM 60 60  -     Minutes 15 15  -     METs 4.7 4.7  -       NuStep   Level _0 SPM 100 100 100     Minutes _1 METs 3.1 3.1 4.7       REL-XR   Level _2 Minutes _3 METs 6 6 4.6       Home Exercise Plan   Plans to continue exercise at  - Home (comment)  chair and hand weight exercises Home (comment)  chair and hand weight exercises     Frequency  - Add 2 additional days to program exercise sessions. Add 2 additional days to program exercise sessions.     Initial Home Exercises Provided  - 12/18/16 12/18/16        Exercise Comments:     Exercise Comments    Row Name 12/02/16 0847 12/06/16 0854 12/27/16 0844       Exercise Comments First full day of exercise with [place]!  Harden had transferred from [facility]. Patient was oriented to gym and equipment including functions, settings, policies, and procedures.  Patient's individual exercise prescription and treatment plan were reviewed.  All starting workloads were established based on the results of the 6 minute walk test done at initial orientation visit.  The plan for exercise progression was also introduced and progression will be customized based on patient's performance and goals. Horacio says he prefers not to use the Recumbent Bike since this one does not allow him to use his arms. I  suggested the Airdyne bicycle since you move your feet and arms and he wanted to wait until the next Cardiac Rehab session to try that Reviewed METs average and discussed progression with pt today.        Exercise Goals and Review:   Exercise Goals Re-Evaluation :     Exercise Goals Re-Evaluation    Row Name 12/09/16 0926 12/18/16 0917 12/25/16 1513         Exercise Goal Re-Evaluation   Exercise Goals Review Increase Physical Activity;Increase Strenth and Stamina Increase Physical Activity Increase Physical Activity;Increase Strenth and Stamina     Comments Edwyn started intervals today on the Recumbent bike and XR.  He is feeling stronger overall.  He is not currently walking or exercising at home.   Reviewed home exercise with pt today.  Pt plans to do chair exercises and weights at home for exercise.  Reviewed THR, pulse, RPE, sign and symptoms, and when to  call 911 or MD.  Also discussed weather considerations and indoor options.  Pt voiced understanding. Jaleel has been doing well in rehab.  He is doing the XR for 30 min now and still keeping the NuStep as well.  He is up to level 7 on the NuStep.  We will continue to monitor his progress.     Expected Outcomes Short; Review home exercise guidelines.  Long: Continue to attend rehab to make exercise part of his routine. Short: Start adding in exercise at home.  Long: Become more independent with home exercise. Short: Roylee will increase his workload on the XR.  Long: Kallen will continue to come to class to increase his strength and stamina.        Discharge Exercise Prescription (Final Exercise Prescription Changes):     Exercise Prescription Changes - 12/25/16 1500      Response to Exercise   Blood Pressure (Admit) 126/54   Blood Pressure (Exercise) 146/70   Blood Pressure (Exit) 128/66   Heart Rate (Admit) 80 bpm   Heart Rate (Exercise) 119 bpm   Heart Rate (Exit) 63 bpm   Rating of Perceived Exertion (Exercise) 13   Symptoms none    Comments transfer from [facility]   Duration Continue with 45 min of aerobic exercise without signs/symptoms of physical distress.   Intensity THRR unchanged     Progression   Progression Continue to progress workloads to maintain intensity without signs/symptoms of physical distress.   Average METs 4.65     Resistance Training   Training Prescription Yes   Weight 5 lbs   Reps 10-15     Interval Training   Interval Training Yes   Equipment T5 Nustep;Recumbant Bike;REL-XR   Comments 1 min on 3 min off     NuStep   Level 7   SPM 100   Minutes 15   METs 4.7     REL-XR   Level 2   Minutes 15   METs 4.6     Home Exercise Plan   Plans to continue exercise at Home (comment)  chair and hand weight exercises   Frequency Add 2 additional days to program exercise sessions.   Initial Home Exercises Provided 12/18/16      Nutrition:  Target Goals: Understanding of nutrition guidelines, daily intake of sodium <154m, cholesterol <2010m calories 30% from fat and 7% or less from saturated fats, daily to have 5 or more servings of fruits and vegetables.  Biometrics:     Pre Biometrics - 11/28/16 1527      Pre Biometrics   Height 6' 0.25" (1.835 m)   Weight 209 lb 9.6 oz (95.1 kg)   Waist Circumference 40.75 inches   Hip Circumference 43 inches   Waist to Hip Ratio 0.95 %   BMI (Calculated) 28.3   Single Leg Stand 22.41 seconds         Post Biometrics - 01/08/17 1140       Post  Biometrics   Height 6' 0.25" (1.835 m)   Weight 206 lb (93.4 kg)   Waist Circumference 39.5 inches   Hip Circumference 42 inches   Waist to Hip Ratio 0.94 %   BMI (Calculated) 27.8   Single Leg Stand 27.46 seconds      Nutrition Therapy Plan and Nutrition Goals:     Nutrition Therapy & Goals - 12/27/16 0820      Nutrition Therapy   Diet RD visit remains deferred  Saw RD at other Cardiac Rehab  program before he transferred here.      Nutrition Discharge: Rate Your Plate  Scores:     Nutrition Assessments - 12/25/16 0908      MEDFICTS Scores   Pre Score 134   Post Score 84   Score Difference -50      Nutrition Goals Re-Evaluation:     Nutrition Goals Re-Evaluation    Row Name 12/09/16 0948             Goals   Current Weight 208 lb (94.3 kg)       Comment Met with dietician previously while in [facility] and does not want to meet now.  He says he knows his biggest issue in portion control       Expected Outcome Short and Long: Work on portion control.          Nutrition Goals Discharge (Final Nutrition Goals Re-Evaluation):     Nutrition Goals Re-Evaluation - 12/09/16 0948      Goals   Current Weight 208 lb (94.3 kg)   Comment Met with dietician previously while in [facility] and does not want to meet now.  He says he knows his biggest issue in portion control   Expected Outcome Short and Long: Work on portion control.      Psychosocial: Target Goals: Acknowledge presence or absence of significant depression and/or stress, maximize coping skills, provide positive support system. Participant is able to verbalize types and ability to use techniques and skills needed for reducing stress and depression.   Initial Review & Psychosocial Screening:     Initial Psych Review & Screening - 11/28/16 1530      Initial Review   Current issues with Current Sleep Concerns  Pt has sleep apnea and has a CPAP but states he is unable to use it because it "causes more sleep problems than when I don't use it"     [place]? Yes   Comments Lost a child 10 years ago.       Barriers   Psychosocial barriers to participate in program The patient should benefit from training in stress management and relaxation.     Screening Interventions   Interventions Encouraged to exercise      Quality of Life Scores:      Quality of Life - 12/25/16 0908      Quality of Life Scores   Health/Function Pre 22.4 %    Health/Function Post 21.57 %   Health/Function % Change -3.71 %   Socioeconomic Pre 24 %   Socioeconomic Post 26.57 %   Socioeconomic % Change  10.71 %   Psych/Spiritual Pre 24.86 %   Psych/Spiritual Post 24.86 %   Psych/Spiritual % Change 0 %   Family Pre 21.6 %   Family Post 22.8 %   Family % Change 5.56 %   GLOBAL Pre 23.09 %   GLOBAL Post 23.46 %   GLOBAL % Change 1.6 %      PHQ-9: Recent Review Flowsheet Data    Depression screen [facility] 2/9 12/25/2016 11/28/2016   Decreased Interest 0 0   Down, Depressed, Hopeless 0 0   PHQ - 2 Score 0 0   Altered sleeping 0 0   Tired, decreased energy 2 3   Change in appetite 0 0   Feeling bad or failure about yourself  0 0   Trouble concentrating 0 0   Moving slowly or fidgety/restless 0 0   Suicidal thoughts 0  0   PHQ-9 Score 2 3   Difficult doing work/chores Somewhat difficult Not difficult at all     Interpretation of Total Score  Total Score Depression Severity:  1-4 = Minimal depression, 5-9 = Mild depression, 10-14 = Moderate depression, 15-19 = Moderately severe depression, 20-27 = Severe depression   Psychosocial Evaluation and Intervention:     Psychosocial Evaluation - 12/11/16 0922      Psychosocial Evaluation & Interventions   Interventions Encouraged to exercise with the program and follow exercise prescription   Comments Counselor met today with Mr. Aument Montesinos) for initial psychosocial evaluation.  He is a 76 yr old who had open heart surgery in December for an aortic valve replacement.  He has a strong support system with a spouse of 53 years; a daughter who lives locally; and active involvement in his church community.  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  He states that he sleeps well and has a "great" appetite.  Garin denies a history of depression or anxiety or any current symptoms.  He states he is typically in a positive  mood and has minimal stress in his life.  His goals for this program are to increase his stamina and strength and lose some weight.  He plans to maintain exercising consistently following this program by use of home equipment.   Staff will follow with Jaquis throughout the course of this program.     Expected Outcomes Eryck will exercise consistently with this program to increase his stamina and strength.  He will meet with the dietician to address his weight loss goals.       Continue Psychosocial Services  Follow up required by staff      Psychosocial Re-Evaluation:     Psychosocial Re-Evaluation    Row Name 12/09/16 [phone] 12/27/16 0817           Psychosocial Re-Evaluation   Current issues with Current Stress Concerns Current Stress Concerns      Comments Sleeping pretty good.  Tries not to let stress get to him.  However, his daughter has just been diagnosised with ovarian cancer for the 3rd time.  He participates in antique tractor pull for fun.  He uses this as part of his rehab.  Daughter is seeing MD to discuss radiation treatment.  She continues to work at this time.        Expected Outcomes Short: Meet with Juliann Pulse   Long: Continue to exercise and use his hobbies as outlets [place] will continue to use his faith to help him deal with the stress in his life. Continue to exercise and do enjoyable activities as he deals with stress in his life.      Interventions Encouraged to attend Cardiac Rehabilitation for the exercise;Therapist referral  -      Continue Psychosocial Services  Follow up required by counselor Follow up required by counselor        Initial Review   Source of Stress Concerns Family Family         Psychosocial Discharge (Final Psychosocial Re-Evaluation):     Psychosocial Re-Evaluation - 12/27/16 0817      Psychosocial Re-Evaluation   Current issues with Current Stress Concerns   Comments Daughter is seeing MD to discuss radiation treatment.  She continues to work at  this time.     Expected Outcomes [place] will continue to use his faith  to help him deal with the stress in his life. Continue to exercise and do enjoyable activities as he deals with stress in his life.   Continue Psychosocial Services  Follow up required by counselor     Initial Review   Source of Stress Concerns Family      Vocational Rehabilitation: Provide vocational rehab assistance to qualifying candidates.   Vocational Rehab Evaluation & Intervention:     Vocational Rehab - 11/28/16 1534      Initial Vocational Rehab Evaluation & Intervention   Assessment shows need for Vocational Rehabilitation No  Pt is retired and declines      Education: Education Goals: Education classes will be provided on a weekly basis, covering required topics. Participant will state understanding/return demonstration of topics presented.  Learning Barriers/Preferences:     Learning Barriers/Preferences - 11/28/16 1534      Learning Barriers/Preferences   Learning Barriers None   Learning Preferences Audio;Individual Instruction;Verbal Instruction;Skilled Demonstration;Group Instruction      Education Topics: General Nutrition Guidelines/Fats and Fiber: -Group instruction provided by verbal, written material, models and posters to present the general guidelines for heart healthy nutrition. Gives an explanation and review of dietary fats and fiber.   Controlling Sodium/Reading Food Labels: -Group verbal and written material supporting the discussion of sodium use in heart healthy nutrition. Review and explanation with models, verbal and written materials for utilization of the food label.   Cardiac Rehab from 01/08/2017 in [facility] Cardiac and Pulmonary Rehab  Date  12/02/16  Educator  CR  Instruction Review Code  2- meets goals/outcomes      Exercise Physiology & Risk Factors: - Group verbal and written instruction with models to review the exercise physiology of the cardiovascular system  and associated critical values. Details cardiovascular disease risk factors and the goals associated with each risk factor.   Cardiac Rehab from 01/08/2017 in [facility] Cardiac and Pulmonary Rehab  Date  12/09/16  Educator  [facility]  Instruction Review Code  2- meets goals/outcomes      Aerobic Exercise & Resistance Training: - Gives group verbal and written discussion on the health impact of inactivity. On the components of aerobic and resistive training programs and the benefits of this training and how to safely progress through these programs.   Cardiac Rehab from 01/08/2017 in [facility] Cardiac and Pulmonary Rehab  Date  12/11/16  Educator  [facility]  Instruction Review Code  2- meets goals/outcomes      Flexibility, Balance, General Exercise Guidelines: - Provides group verbal and written instruction on the benefits of flexibility and balance training programs. Provides general exercise guidelines with specific guidelines to those with heart or lung disease. Demonstration and skill practice provided.   Cardiac Rehab from 01/08/2017 in [facility] Cardiac and Pulmonary Rehab  Date  12/16/16  Educator  [facility]  Instruction Review Code  2- meets goals/outcomes      Stress Management: - Provides group verbal and written instruction about the health risks of elevated stress, cause of high stress, and healthy ways to reduce stress.   Cardiac Rehab from 01/08/2017 in [facility] Cardiac and Pulmonary Rehab  Date  12/25/16  Educator  [facility]  Instruction Review Code  2- meets goals/outcomes      Depression: - Provides group verbal and written instruction on the correlation between heart/lung disease and depressed mood, treatment options, and the stigmas associated with seeking treatment.   Anatomy & Physiology of the Heart: - Group verbal and written instruction and models provide basic cardiac  anatomy and physiology, with the coronary electrical and arterial systems. Review of: AMI, Angina, Valve disease, Heart Failure, Cardiac  Arrhythmia, Pacemakers, and the ICD.   Cardiac Rehab from 01/08/2017 in [facility] Cardiac and Pulmonary Rehab  Date  12/23/16  Educator  CE  Instruction Review Code  2- meets goals/outcomes      Cardiac Procedures: - Group verbal and written instruction and models to describe the testing methods done to diagnose heart disease. Reviews the outcomes of the test results. Describes the treatment choices: Medical Management, Angioplasty, or Coronary Bypass Surgery.   Cardiac Rehab from 01/08/2017 in [facility] Cardiac and Pulmonary Rehab  Date  12/30/16  Educator  CE  Instruction Review Code  2- meets goals/outcomes      Cardiac Medications: - Group verbal and written instruction to review commonly prescribed medications for heart disease. Reviews the medication, class of the drug, and side effects. Includes the steps to properly store meds and maintain the prescription regimen.   Cardiac Rehab from 01/08/2017 in [facility] Cardiac and Pulmonary Rehab  Date  01/08/17  Educator  SB  Instruction Review Code  1- partially meets, needs review/practice [part one]      Go Sex-Intimacy & Heart Disease, Get SMART - Goal Setting: - Group verbal and written instruction through game format to discuss heart disease and the return to sexual intimacy. Provides group verbal and written material to discuss and apply goal setting through the application of the S.M.A.R.T. Method.   Cardiac Rehab from 01/08/2017 in [facility] Cardiac and Pulmonary Rehab  Date  12/30/16  Educator  CE  Instruction Review Code  2- meets goals/outcomes      Other Matters of the Heart: - Provides group verbal, written materials and models to describe Heart Failure, Angina, Valve Disease, and Diabetes in the realm of heart disease. Includes description of the disease process and treatment options available to the cardiac patient.   Cardiac Rehab from 01/08/2017 in [facility] Cardiac and Pulmonary Rehab  Date  12/23/16  Educator  CE  Instruction Review Code   2- meets goals/outcomes      Exercise & Equipment Safety: - Individual verbal instruction and demonstration of equipment use and safety with use of the equipment.   Cardiac Rehab from 01/08/2017 in [facility] Cardiac and Pulmonary Rehab  Date  11/28/16  Educator  PS  Instruction Review Code  2- meets goals/outcomes      Infection Prevention: - Provides verbal and written material to individual with discussion of infection control including proper hand washing and proper equipment cleaning during exercise session.   Cardiac Rehab from 01/08/2017 in [facility] Cardiac and Pulmonary Rehab  Date  11/28/16  Educator  PS  Instruction Review Code  2- meets goals/outcomes      Falls Prevention: - Provides verbal and written material to individual with discussion of falls prevention and safety.   Cardiac Rehab from 01/08/2017 in [facility] Cardiac and Pulmonary Rehab  Date  11/28/16  Educator  PS  Instruction Review Code  2- meets goals/outcomes      Diabetes: - Individual verbal and written instruction to review signs/symptoms of diabetes, desired ranges of glucose level fasting, after meals and with exercise. Advice that pre and post exercise glucose checks will be done for 3 sessions at entry of program.    Knowledge Questionnaire Score:     Knowledge Questionnaire Score - 12/25/16 0908      Knowledge Questionnaire Score   Pre Score 21/28   Post Score 25/28  Core Components/Risk Factors/Patient Goals at Admission:     Personal Goals and Risk Factors at Admission - 12/27/16 0801      Core Components/Risk Factors/Patient Goals on Admission   Admit Weight 209 lb 9.6 oz (95.1 kg)   Goal Weight: Short Term 204 lb (92.5 kg)   Goal Weight: Long Term 195 lb (88.5 kg)      Core Components/Risk Factors/Patient Goals Review:      Goals and Risk Factor Review    Row Name 12/09/16 6226 12/27/16 0802           Core Components/Risk Factors/Patient Goals Review   Personal Goals Review  Weight Management/Obesity;Hypertension;Lipids Hypertension;Lipids      Review Kj has been doing well in rehab since his trasnfer.  His blood pressures have been good and he has not had any problems with his medications.  His weight has also been steady. Buel states he has lost 4 pounds since starting, he contnues to "cut back" as he was advised by the RD at [facility] before he transferred to this program. He is compliant with his medications and is enjoying the exercise.      Expected Outcomes Short and Long: Continue to work on his weight and attend rehab to work on risk factor modification. Short and Long: Continue to work on his weight and attend rehab to work on risk factor modification.  Continue with nutrition plan for weight loss.         Core Components/Risk Factors/Patient Goals at Discharge (Final Review):      Goals and Risk Factor Review - 12/27/16 0802      Core Components/Risk Factors/Patient Goals Review   Personal Goals Review Hypertension;Lipids   Review Anias states he has lost 4 pounds since starting, he contnues to "cut back" as he was advised by the RD at [facility] before he transferred to this program. He is compliant with his medications and is enjoying the exercise.   Expected Outcomes Short and Long: Continue to work on his weight and attend rehab to work on risk factor modification.  Continue with nutrition plan for weight loss.      ITP Comments:     ITP Comments    Row Name 11/28/16 1507 12/06/16 0853 12/18/16 0544       ITP Comments Medical review completed;  initial ITP completed; diagnosis documentation is in [facility] discharge summary note by Dr. Cheree Ditto on 09/02/2016 Parminder says he prefers not to use the Recumbent Bike since this one does not allow him to use his arms. I suggested the Airdyne bicycle since you move your feet and arms and he wanted to wait until the next Cardiac Rehab session to try that. 30 day review. Continue  with ITP unless directed changes per Medical Director review        Comments: Discharge ITP

## 2017-01-10 NOTE — Progress Notes (Signed)
Discharge Summary  Patient Details  Name: Jeremy Mason MRN: [id number] Date of Birth: 04/27/1941 Referring Provider:     Cardiac Rehab from 11/28/2016 in [facility] Cardiac and Pulmonary Rehab  Referring Provider  Gaca       Number of Visits: 36/36  Reason for Discharge:  Patient reached a stable level of exercise. Patient independent in their exercise.  Smoking History:  History  Smoking Status  . Former Smoker  . Quit date: 09/23/1996  Smokeless Tobacco  . Never Used    Diagnosis:  S/P aortic valve replacement  ADL UCSD:   Initial Exercise Prescription:     Initial Exercise Prescription - 11/28/16 1500      Date of Initial Exercise RX and Referring Provider   Date 11/28/16   Referring Provider Gaca     Treadmill   MPH 2   Grade 0   Minutes 6  6/6     Recumbant Bike   Level 1   RPM 60   Minutes 15   METs 2.5     NuStep   Level 3   SPM 80   Minutes 15   METs 2.5     T5 Nustep   Level 2   SPM 60   Minutes 15   METs 2.5     Prescription Details   Frequency (times per week) 3   Duration Progress to 45 minutes of aerobic exercise without signs/symptoms of physical distress     Intensity   THRR 40-80% of Max Heartrate 100-129   Ratings of Perceived Exertion 11-15   Perceived Dyspnea 0-4     Resistance Training   Training Prescription Yes   Weight 3   Reps 10-15      Discharge Exercise Prescription (Final Exercise Prescription Changes):     Exercise Prescription Changes - 12/25/16 1500      Response to Exercise   Blood Pressure (Admit) 126/54   Blood Pressure (Exercise) 146/70   Blood Pressure (Exit) 128/66   Heart Rate (Admit) 80 bpm   Heart Rate (Exercise) 119 bpm   Heart Rate (Exit) 63 bpm   Rating of Perceived Exertion (Exercise) 13   Symptoms none   Comments transfer from [facility]   Duration Continue with 45 min of aerobic exercise without signs/symptoms of physical distress.   Intensity THRR unchanged     Progression   Progression Continue to progress workloads to maintain intensity without signs/symptoms of physical distress.   Average METs 4.65     Resistance Training   Training Prescription Yes   Weight 5 lbs   Reps 10-15     Interval Training   Interval Training Yes   Equipment T5 Nustep;Recumbant Bike;REL-XR   Comments 1 min on 3 min off     NuStep   Level 7   SPM 100   Minutes 15   METs 4.7     REL-XR   Level 2   Minutes 15   METs 4.6     Home Exercise Plan   Plans to continue exercise at Home (comment)  chair and hand weight exercises   Frequency Add 2 additional days to program exercise sessions.   Initial Home Exercises Provided 12/18/16      Functional Capacity:     6 Minute Walk    Row Name 11/28/16 1528 01/08/17 1141       6 Minute Walk   Phase  - Discharge    Distance 1404 feet 1648 feet    Walk  Time 6 minutes 6 minutes    # of Rest Breaks 0 0    MPH 2.65 3.12    METS 2.83 3.56    RPE 13 12    VO2 Peak 9.9 12.48    Symptoms Yes (comment)  -    Comments Hip pain due to muscle atrophy from suregry 2 years ago  -    Resting HR 72 bpm 60 bpm    Resting BP 132/64 158/78    Max Ex. HR 97 bpm 98 bpm    Max Ex. BP 158/62 178/76       Psychological, QOL, Others - Outcomes: PHQ 2/9: Depression screen [facility] 2/9 12/25/2016 11/28/2016  Decreased Interest 0 0  Down, Depressed, Hopeless 0 0  PHQ - 2 Score 0 0  Altered sleeping 0 0  Tired, decreased energy 2 3  Change in appetite 0 0  Feeling bad or failure about yourself  0 0  Trouble concentrating 0 0  Moving slowly or fidgety/restless 0 0  Suicidal thoughts 0 0  PHQ-9 Score 2 3  Difficult doing work/chores Somewhat difficult Not difficult at all    Quality of Life:     Quality of Life - 12/25/16 0908      Quality of Life Scores   Health/Function Pre 22.4 %   Health/Function Post 21.57 %   Health/Function % Change -3.71 %   Socioeconomic Pre 24 %   Socioeconomic Post 26.57 %   Socioeconomic % Change   10.71 %   Psych/Spiritual Pre 24.86 %   Psych/Spiritual Post 24.86 %   Psych/Spiritual % Change 0 %   Family Pre 21.6 %   Family Post 22.8 %   Family % Change 5.56 %   GLOBAL Pre 23.09 %   GLOBAL Post 23.46 %   GLOBAL % Change 1.6 %      Personal Goals: Goals established at orientation with interventions provided to work toward goal.     Personal Goals and Risk Factors at Admission - 12/27/16 0801      Core Components/Risk Factors/Patient Goals on Admission   Admit Weight 209 lb 9.6 oz (95.1 kg)   Goal Weight: Short Term 204 lb (92.5 kg)   Goal Weight: Long Term 195 lb (88.5 kg)       Personal Goals Discharge:     Goals and Risk Factor Review    Row Name 12/09/16 1610 12/27/16 0802           Core Components/Risk Factors/Patient Goals Review   Personal Goals Review Weight Management/Obesity;Hypertension;Lipids Hypertension;Lipids      Review Jeremy Mason has been doing well in rehab since his trasnfer.  His blood pressures have been good and he has not had any problems with his medications.  His weight has also been steady. Jeremy Mason states he has lost 4 pounds since starting, he contnues to "cut back" as he was advised by the RD at [facility] before he transferred to this program. He is compliant with his medications and is enjoying the exercise.      Expected Outcomes Short and Long: Continue to work on his weight and attend rehab to work on risk factor modification. Short and Long: Continue to work on his weight and attend rehab to work on risk factor modification.  Continue with nutrition plan for weight loss.         Nutrition & Weight - Outcomes:     Pre Biometrics - 11/28/16 1527  Pre Biometrics   Height 6' 0.25" (1.835 m)   Weight 209 lb 9.6 oz (95.1 kg)   Waist Circumference 40.75 inches   Hip Circumference 43 inches   Waist to Hip Ratio 0.95 %   BMI (Calculated) 28.3   Single Leg Stand 22.41 seconds         Post Biometrics - 01/08/17 1140        Post  Biometrics   Height 6' 0.25" (1.835 m)   Weight 206 lb (93.4 kg)   Waist Circumference 39.5 inches   Hip Circumference 42 inches   Waist to Hip Ratio 0.94 %   BMI (Calculated) 27.8   Single Leg Stand 27.46 seconds      Nutrition:     Nutrition Therapy & Goals - 12/27/16 0820      Nutrition Therapy   Diet RD visit remains deferred  Saw RD at other Cardiac Rehab program before he transferred here.      Nutrition Discharge:     Nutrition Assessments - 12/25/16 0908      MEDFICTS Scores   Pre Score 134   Post Score 84   Score Difference -50      Education Questionnaire Score:     Knowledge Questionnaire Score - 12/25/16 0908      Knowledge Questionnaire Score   Pre Score 21/28   Post Score 25/28      Goals reviewed with patient; copy given to patient.

## 2017-01-10 NOTE — Progress Notes (Signed)
Daily Session Note  Patient Details  Name: Jeremy Mason MRN: [id number] Date of Birth: 1941-01-16 Referring Provider:     Cardiac Rehab from 11/28/2016 in [facility] Cardiac and Pulmonary Rehab  Referring Provider  Gaca      Encounter Date: 01/10/2017  Check In:     Session Check In - 01/10/17 0817      Check-In   Location [facility]-Cardiac & Pulmonary Rehab   Staff Present Gerlene Burdock, RN, Vickki Hearing, BA, ACSM CEP, Exercise Physiologist;Jessica Luan Pulling, MA, ACSM RCEP, Exercise Physiologist   Supervising physician immediately available to respond to emergencies See telemetry face sheet for immediately available ER MD   Medication changes reported     No   Fall or balance concerns reported    No   Warm-up and Cool-down Performed on first and last piece of equipment   Resistance Training Performed Yes   VAD Patient? No     Pain Assessment   Currently in Pain? No/denies         History  Smoking Status  . Former Smoker  . Quit date: 09/23/1996  Smokeless Tobacco  . Never Used    Goals Met:  Proper associated with RPD/PD & O2 Sat Exercise tolerated well  Goals Unmet:  Not Applicable  Comments:     Dr. Emily Filbert is Medical Director for [facility] Pul[facility].

## 2017-01-15 ENCOUNTER — Ambulatory Visit: Payer: Medicare Other

## 2017-01-15 VITALS — BP 140/70 | HR 66

## 2017-01-15 DIAGNOSIS — R279 Unspecified lack of coordination: Secondary | ICD-10-CM

## 2017-01-15 DIAGNOSIS — R2681 Unsteadiness on feet: Secondary | ICD-10-CM | POA: Diagnosis not present

## 2017-01-15 NOTE — Therapy (Signed)
[place] [facility] MAIN [facility] SERVICES [address] [place], [place], [address] Phone: [phone]   Fax:  [phone]  Physical Therapy Treatment  Patient Details  Name: Jeremy Mason MRN: [id number] Date of Birth: December 21, 1940 Referring Provider: Cristopher Peru MD  Encounter Date: 01/15/2017      PT End of Session - 01/15/17 0900    Visit Number 4   Number of Visits 12   Date for PT Re-Evaluation 02/07/17   Authorization Type 4/10 G code   PT Start Time 0850   PT Stop Time 0935   PT Time Calculation (min) 45 min   Equipment Utilized During Treatment Gait belt   Activity Tolerance Patient tolerated treatment well   Behavior During Therapy [facility] for tasks assessed/performed      Past Medical History:  Diagnosis Date  . Aortic aneurysm (HCC)   . Aortic stenosis   . Arthritis    hips, legs  . Atrial fibrillation (HCC)   . Barrett's esophagus   . Celiac disease   . COPD (chronic obstructive pulmonary disease) (HCC)   . GERD (gastroesophageal reflux disease)   . Heart murmur   . History of hiatal hernia   . Hypertension   . PONV (postoperative nausea and vomiting)    nausea after cataract  . Sleep apnea    no CPAP  . Vertigo    no episodes - 2-3 yrs    Past Surgical History:  Procedure Laterality Date  . CARDIAC CATHETERIZATION    . CARDIAC ELECTROPHYSIOLOGY STUDY AND ABLATION    . CARDIOVERSION    . CATARACT EXTRACTION W/PHACO Right 03/13/2016   Procedure: CATARACT EXTRACTION PHACO AND INTRAOCULAR LENS PLACEMENT (IOC);  Surgeon: Lockie Mola, MD;  Location: [facility];  Service: Ophthalmology;  Laterality: Right;  sleep apnea - no CPAP  . CATARACT EXTRACTION W/PHACO Left 04/10/2016   Procedure: CATARACT EXTRACTION PHACO AND INTRAOCULAR LENS PLACEMENT (IOC) left eye;  Surgeon: Lockie Mola, MD;  Location: [facility];  Service: Ophthalmology;  Laterality: Left;  . COLONOSCOPY    . TUMOR EXCISION     hip  bone    Vitals:   01/15/17 0852  BP: 140/70  Pulse: 66  SpO2: 99%        Subjective Assessment - 01/15/17 0853    Subjective Pt is doing well on this date. He has a mild headache at this time. Pt has a history of chronic headaches. He is performing HEP but reports that the exercises sometimes worsen his headaches. He has no specific questions or concerns at this time.    Limitations Lifting   Currently in Pain? Yes   Pain Score --  Does not rate when asked   Pain Location Head         ?    TREATMENT:  Extensive history and symptom review with patient including onset, aggravating factors, and response to intervention. Extensive HEP review with patient  VOR x 1 VOR x 1 in standing with feet together initially (unable) so progressed to feet apart with plain background 60s x 3, extensive cues for correct cervical rotation, speed, and gaze fixation. Reports 4/10 dizziness as well as increase in head pressure, pt provided seated rest breaks between bouts for symptoms to resolve;  Semitandem Semitandem balance with horizontal head turns alternating LE forward 30s x 3 bilateral;  Airex Airex balance beam with side stepping x 2 lengths; Airex balance beam with side stepping and horizontal head turns x 6 lengths;  Extensive HEP review with patient and issued new exercises for program. Discussed at length proper form/technique.                       PT Education - 01/15/17 1247    Education provided Yes   Education Details HEP modified   Person(s) Educated Patient   Methods Explanation   Comprehension Verbalized understanding             PT Long Term Goals - 12/27/16 1135      PT LONG TERM GOAL #1   Title Patient will be independent with HEP to demonstrate improvement after discharge from physical therapy   Baseline Dependent in form/technique   Time 6   Period Weeks   Status New     PT LONG TERM GOAL #2   Title Patient will score a 24/24 on  the DGI to demonstrate significant improvement in gait stability when ambulating   Baseline 23/24 DGI   Time 6   Period Weeks   Status New     PT LONG TERM GOAL #3   Title Patient will be able to walk in tandem for 18ft to demonstrate significant improvement in dynamic balance and improved standing balance.   Baseline Patient steps out for support 7 times during test   Time 6   Period Weeks   Status New     PT LONG TERM GOAL #4   Title Patient will be able to perform SLS for >10 sec to demonstrate significant improvement in static balance and ability to wash his LE's in the shower.    Baseline 8 secs B SLS   Time 6   Period Weeks   Status New     PT LONG TERM GOAL #5   Title Patient will score <20 on the DHI to demonstrate significant improvement in dizziness   Baseline DHI: 54   Time 6   Period Weeks   Status New               Plan - 01/15/17 0900    Clinical Impression Statement Pt demonstrates poor technique with VOR exercise during session so made significant modifications to his HEP in order to correct his technique to achieve an appropriate response. Extensive time spend with patient discussing his symptoms, aggravating factors, and home exercise modifications. Pt encouraged to follow-up as scheduled. Will continue to progress HEP as pt demonstrates appropriate mastery of form/technique.   Rehab Potential Fair   Clinical Impairments Affecting Rehab Potential (+) highly motivated, faimly support (-) Symptoms evolving, >3 coormorbitites   PT Frequency 2x / week   PT Duration 6 weeks   PT Treatment/Interventions ADLs/Self Care Home Management;Gait training;Stair training;Functional mobility training;Therapeutic activities;Balance training;Therapeutic exercise;Patient/family education;Neuromuscular re-education;Canalith Repostioning;Energy conservation   PT Next Visit Plan Habituation interventions for dizziness   PT Home Exercise Plan VOR x 1 in standing with feet  apart, plain background 60s x 3, 4x/day, Semitandem balance with horizontal head turns 30s/each LE x 3 each, 3 times/day;   Consulted and Agree with Plan of Care Patient      Patient will benefit from skilled therapeutic intervention in order to improve the following deficits and impairments:  Abnormal gait, Decreased balance, Decreased activity tolerance, Decreased endurance, Decreased strength, Decreased coordination, Difficulty walking, Decreased mobility, Dizziness, Increased fascial restricitons  Visit Diagnosis: Unsteadiness on feet  Unspecified lack of coordination     Problem List Patient Active Problem List   Diagnosis Date Noted  . Barrett  esophagus 11/28/2016  . Celiac disease 11/28/2016  . GERD (gastroesophageal reflux disease) 11/28/2016  . Sleep apnea 11/28/2016  . History of aortic valve replacement 09/25/2016  . Palpitations 09/09/2016  . Encounter for management of temporary pacemaker 08/29/2016  . Metabolic acidosis 08/29/2016  . S/P aortic valve replacement with bioprosthetic valve 08/29/2016  . Leg length discrepancy 04/06/2015  . Tendinopathy of left gluteus medius 02/25/2015  . Left hip pain 12/22/2014  . Spondylosis of lumbar region without myelopathy or radiculopathy 12/22/2014  . COPD with emphysema (HCC) 12/09/2014  . Stenosis of lumbosacral spine 11/10/2014  . 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 AS (aortic stenosis) 09/22/2013  . Bursitis of left hip 02/02/2013  . Atherosclerosis of native artery of extremity with intermittent claudication (HCC) 12/28/2012  . Facet arthritis of lumbar region (HCC) 12/01/2012  . DDD (degenerative disc disease), lumbar 11/23/2012  . Low back pain 11/23/2012  . Dyslipidemia 04/09/2012  . Hypertension 04/09/2012  . Paroxysmal atrial fibrillation (HCC) 04/09/2012  . Pulmonary nodule 12/05/2011   Lynnea Maizes PT, DPT   Richar Dunklee 01/15/2017, 1:07 PM  [facility] [address] MAIN [facility] SERVICES [address] [place], [place], [address] Phone: [phone]   Fax:  [phone]  Name: JAIDYN KUHL MRN: [id number] Date of Birth: 19-Jan-1941

## 2017-01-15 NOTE — Patient Instructions (Addendum)
Feet Heel-Toe "Tandem", Head Motion - Eyes Open    With eyes open, right foot in front of the other but slightly out to the side, move head slowly: left and right for 30 seconds. Change foot position (left in front of right) and perform again for an additional 30 seconds. Repeat __3_ times per session on each side. Do __3__ sessions per day.

## 2017-01-17 ENCOUNTER — Ambulatory Visit: Payer: Medicare Other

## 2017-01-22 ENCOUNTER — Ambulatory Visit: Payer: Medicare Other | Admitting: Physical Therapy

## 2017-01-24 ENCOUNTER — Ambulatory Visit: Payer: Medicare Other | Attending: Neurology

## 2017-01-24 DIAGNOSIS — R279 Unspecified lack of coordination: Secondary | ICD-10-CM | POA: Diagnosis present

## 2017-01-24 DIAGNOSIS — R2681 Unsteadiness on feet: Secondary | ICD-10-CM | POA: Insufficient documentation

## 2017-01-24 DIAGNOSIS — M6281 Muscle weakness (generalized): Secondary | ICD-10-CM | POA: Diagnosis present

## 2017-01-24 NOTE — Therapy (Signed)
[place] [facility] MAIN [facility] SERVICES [address] [place], [place], [address] Phone: [phone]   Fax:  [phone]  Physical Therapy Treatment  Patient Details  Name: Jeremy Mason MRN: [id number] Date of Birth: 05/12/1941 Referring Provider: Cristopher Peru MD  Encounter Date: 01/24/2017      PT End of Session - 01/24/17 0946    Visit Number 5   Number of Visits 12   Date for PT Re-Evaluation 02/07/17   Authorization Type 5/10 G code   PT Start Time 0915   PT Stop Time 1000   PT Time Calculation (min) 45 min   Equipment Utilized During Treatment Gait belt   Activity Tolerance Patient tolerated treatment well   Behavior During Therapy [facility] for tasks assessed/performed      Past Medical History:  Diagnosis Date  . Aortic aneurysm (HCC)   . Aortic stenosis   . Arthritis    hips, legs  . Atrial fibrillation (HCC)   . Barrett's esophagus   . Celiac disease   . COPD (chronic obstructive pulmonary disease) (HCC)   . GERD (gastroesophageal reflux disease)   . Heart murmur   . History of hiatal hernia   . Hypertension   . PONV (postoperative nausea and vomiting)    nausea after cataract  . Sleep apnea    no CPAP  . Vertigo    no episodes - 2-3 yrs    Past Surgical History:  Procedure Laterality Date  . CARDIAC CATHETERIZATION    . CARDIAC ELECTROPHYSIOLOGY STUDY AND ABLATION    . CARDIOVERSION    . CATARACT EXTRACTION W/PHACO Right 03/13/2016   Procedure: CATARACT EXTRACTION PHACO AND INTRAOCULAR LENS PLACEMENT (IOC);  Surgeon: Lockie Mola, MD;  Location: [facility];  Service: Ophthalmology;  Laterality: Right;  sleep apnea - no CPAP  . CATARACT EXTRACTION W/PHACO Left 04/10/2016   Procedure: CATARACT EXTRACTION PHACO AND INTRAOCULAR LENS PLACEMENT (IOC) left eye;  Surgeon: Lockie Mola, MD;  Location: [facility];  Service: Ophthalmology;  Laterality: Left;  . COLONOSCOPY    . TUMOR EXCISION     hip  bone    There were no vitals filed for this visit.      Subjective Assessment - 01/24/17 0922    Subjective Patient reports he is doing well and is having decreased HA symptoms today versus previous visits. Patient states he has good days and bad days with the head turning and reports    Pertinent History R sided Hip Surgery surgery in 1974 with lead to decreased activation of R hip musculature, Heart valve replacement   Limitations Lifting   Patient Stated Goals To improve balance   Currently in Pain? Yes   Pain Score 3    Pain Location Head   Pain Orientation Anterior;Upper   Pain Descriptors / Indicators Headache;Constant   Pain Type Acute pain   Pain Frequency Intermittent      TREATMENT:   Neuromuscular Rehab: Feet togethter on blue aiirex pad head turns left and right; up and down with focus on card 10 ft in front - x 10 each direction Tandem stance eyes closed on airec -  30sec x 2  Tandem stance on airex pad head turns right/left, up/down - 30sec x 2 Feet together on airex with eyes focused on a busy background left/right, diagonally, and up/down - x10 each direction Feet together with maintained gaze on busy surface with head turns up/down, left right, circular motions - x 20  Airex  balance beam with side stepping x 8 lengths; Airex balance beam with side stepping and horizontal head turns x 6 lengths; Head circles with walking straight with focus on eye fixation on ball out in front - 6 x 2630ft Walking straight throwing the ball up/down - 6 x 1630ft         PT Education - 01/24/17 0946    Education provided Yes   Education Details form/technique throughout session   Person(s) Educated Patient   Methods Explanation;Demonstration   Comprehension Verbalized understanding;Returned demonstration             PT Long Term Goals - 12/27/16 1135      PT LONG TERM GOAL #1   Title Patient will be independent with HEP to demonstrate improvement after discharge from  physical therapy   Baseline Dependent in form/technique   Time 6   Period Weeks   Status New     PT LONG TERM GOAL #2   Title Patient will score a 24/24 on the DGI to demonstrate significant improvement in gait stability when ambulating   Baseline 23/24 DGI   Time 6   Period Weeks   Status New     PT LONG TERM GOAL #3   Title Patient will be able to walk in tandem for 7020ft to demonstrate significant improvement in dynamic balance and improved standing balance.   Baseline Patient steps out for support 7 times during test   Time 6   Period Weeks   Status New     PT LONG TERM GOAL #4   Title Patient will be able to perform SLS for >10 sec to demonstrate significant improvement in static balance and ability to wash his LE's in the shower.    Baseline 8 secs B SLS   Time 6   Period Weeks   Status New     PT LONG TERM GOAL #5   Title Patient will score <20 on the DHI to demonstrate significant improvement in dizziness   Baseline DHI: 54   Time 6   Period Weeks   Status New               Plan - 01/24/17 1022    Clinical Impression Statement Patient demonstrates improved head movement with exercises today demonstrating decresaed dizziness/HA symptoms after performing. Patient demonstrates difficulty with performing circular head movements with eye fixation indicating decreased VOR and vestibular function. Patient will benefit from further skilled therapy to return to prior level of function.    Rehab Potential Fair   Clinical Impairments Affecting Rehab Potential (+) highly motivated, faimly support (-) Symptoms evolving, >3 coormorbitites   PT Frequency 2x / week   PT Duration 6 weeks   PT Treatment/Interventions ADLs/Self Care Home Management;Gait training;Stair training;Functional mobility training;Therapeutic activities;Balance training;Therapeutic exercise;Patient/family education;Neuromuscular re-education;Canalith Repostioning;Energy conservation   PT Next Visit Plan  Habituation interventions for dizziness   PT Home Exercise Plan VOR x 1 in standing with feet apart, plain background 60s x 3, 4x/day, Semitandem balance with horizontal head turns 30s/each LE x 3 each, 3 times/day;   Consulted and Agree with Plan of Care Patient      Patient will benefit from skilled therapeutic intervention in order to improve the following deficits and impairments:  Abnormal gait, Decreased balance, Decreased activity tolerance, Decreased endurance, Decreased strength, Decreased coordination, Difficulty walking, Decreased mobility, Dizziness, Increased fascial restricitons  Visit Diagnosis: Unsteadiness on feet  Unspecified lack of coordination  Muscle weakness (generalized)     Problem  List Patient Active Problem List   Diagnosis Date Noted  . Barrett esophagus 11/28/2016  . Celiac disease 11/28/2016  . GERD (gastroesophageal reflux disease) 11/28/2016  . Sleep apnea 11/28/2016  . History of aortic valve replacement 09/25/2016  . Palpitations 09/09/2016  . Encounter for management of temporary pacemaker 08/29/2016  . Metabolic acidosis 08/29/2016  . S/P aortic valve replacement with bioprosthetic valve 08/29/2016  . Leg length discrepancy 04/06/2015  . Tendinopathy of left gluteus medius 02/25/2015  . Left hip pain 12/22/2014  . Spondylosis of lumbar region without myelopathy or radiculopathy 12/22/2014  . COPD with emphysema (HCC) 12/09/2014  . Stenosis of lumbosacral spine 11/10/2014  . 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 AS (aortic stenosis) 09/22/2013  . Bursitis of left hip 02/02/2013  . Atherosclerosis of native artery of extremity with intermittent claudication (HCC) 12/28/2012  . Facet arthritis of lumbar region (HCC) 12/01/2012  . DDD (degenerative disc disease), lumbar 11/23/2012  . Low back pain 11/23/2012  . Dyslipidemia 04/09/2012  . Hypertension 04/09/2012  . Paroxysmal atrial fibrillation  (HCC) 04/09/2012  . Pulmonary nodule 12/05/2011    Myrene Galas, PT DPT 01/24/2017, 11:08 AM  [place] [facility] MAIN [facility] SERVICES [address] [place], [place], [address] Phone: [phone]   Fax:  [phone]  Name: Jeremy Mason MRN: [id number] Date of Birth: 1941/03/23

## 2017-01-29 ENCOUNTER — Ambulatory Visit: Payer: Medicare Other

## 2017-01-29 ENCOUNTER — Ambulatory Visit: Payer: Medicare Other | Admitting: Physical Therapy

## 2017-01-29 DIAGNOSIS — R2681 Unsteadiness on feet: Secondary | ICD-10-CM | POA: Diagnosis not present

## 2017-01-29 DIAGNOSIS — R279 Unspecified lack of coordination: Secondary | ICD-10-CM

## 2017-01-29 DIAGNOSIS — M6281 Muscle weakness (generalized): Secondary | ICD-10-CM

## 2017-01-29 NOTE — Therapy (Signed)
[place] [facility] MAIN [facility] SERVICES [address] [place], [place], [address] Phone: [phone]   Fax:  [phone]  Physical Therapy Treatment  Patient Details  Name: Jeremy Mason MRN: [id number] Date of Birth: November 06, 1940  Referring Provider: Cristopher Peru MD  Encounter Date: 01/29/2017      PT End of Session - 01/29/17 1304    Visit Number 6   Number of Visits 12   Date for PT Re-Evaluation 02/07/17   Authorization Type 6/10 G code   PT Start Time 1310   PT Stop Time 1323   PT Time Calculation (min) 13 min   Equipment Utilized During Treatment Gait belt   Activity Tolerance Patient tolerated treatment well   Behavior During Therapy [facility] for tasks assessed/performed      Past Medical History:  Diagnosis Date  . Aortic aneurysm (HCC)   . Aortic stenosis   . Arthritis    hips, legs  . Atrial fibrillation (HCC)   . Barrett's esophagus   . Celiac disease   . COPD (chronic obstructive pulmonary disease) (HCC)   . GERD (gastroesophageal reflux disease)   . Heart murmur   . History of hiatal hernia   . Hypertension   . PONV (postoperative nausea and vomiting)    nausea after cataract  . Sleep apnea    no CPAP  . Vertigo    no episodes - 2-3 yrs    Past Surgical History:  Procedure Laterality Date  . CARDIAC CATHETERIZATION    . CARDIAC ELECTROPHYSIOLOGY STUDY AND ABLATION    . CARDIOVERSION    . CATARACT EXTRACTION W/PHACO Right 03/13/2016   Procedure: CATARACT EXTRACTION PHACO AND INTRAOCULAR LENS PLACEMENT (IOC);  Surgeon: Lockie Mola, MD;  Location: [facility];  Service: Ophthalmology;  Laterality: Right;  sleep apnea - no CPAP  . CATARACT EXTRACTION W/PHACO Left 04/10/2016   Procedure: CATARACT EXTRACTION PHACO AND INTRAOCULAR LENS PLACEMENT (IOC) left eye;  Surgeon: Lockie Mola, MD;  Location: [facility];  Service: Ophthalmology;  Laterality: Left;  . COLONOSCOPY    . TUMOR EXCISION     hip  bone    There were no vitals filed for this visit.      Subjective Assessment - 01/29/17 1304    Subjective Pt reports that he had some increased dizziness after his last therapy session. He had a very bad day on Sunday and states that he was so dizzy he could barely move without being afraid of falling over. Yesterday he states that he had a very good day and was able to use his chainsaw without any problem. He is frustrated by the fluctuation in his symptoms. He denies headache today.    Pertinent History R sided Hip Surgery surgery in 1974 with lead to decreased activation of R hip musculature, Heart valve replacement   Limitations Lifting   Patient Stated Goals To improve balance   Currently in Pain? No/denies           TREATMENT:  Neuromuscular Re-education Extensive symptom review with patient. Pt reports a very bad day on Sunday where he was barely able to move without become severely dizzy and unsteady. Pt reports that he checked his BP that day and it was fine. He denies any other neurological symptoms such as N/T, focal weakness, facial droop, slurred speech, difficulty swallowing. With discussion he reports that he did start to increase his gabapentin at his doctor's advised last Thursday and he started having worsening dizziness on  Friday. Advised him to monitor his symptoms since increasing the gabapentin and communicate with his MD if he believes it is contributing to his dizziness. Pt has been monitoring vitals and no excessively high or low BP readings. Denies any visual field deficits, lightheadedness, or presyncopal symptoms.   VOR x 1 VOR x 1 in standing with feet apart x 60 seconds with plain background, extensive cues for correct cervical rotation, speed, and gaze fixation. Reinforced how to perform at home                        PT Education - 01/29/17 1304    Education provided Yes   Education Details Symptom review, plan of care, HEP review    Person(s) Educated Patient   Methods Explanation   Comprehension Verbalized understanding             PT Long Term Goals - 12/27/16 1135      PT LONG TERM GOAL #1   Title Patient will be independent with HEP to demonstrate improvement after discharge from physical therapy   Baseline Dependent in form/technique   Time 6   Period Weeks   Status New     PT LONG TERM GOAL #2   Title Patient will score a 24/24 on the DGI to demonstrate significant improvement in gait stability when ambulating   Baseline 23/24 DGI   Time 6   Period Weeks   Status New     PT LONG TERM GOAL #3   Title Patient will be able to walk in tandem for 7420ft to demonstrate significant improvement in dynamic balance and improved standing balance.   Baseline Patient steps out for support 7 times during test   Time 6   Period Weeks   Status New     PT LONG TERM GOAL #4   Title Patient will be able to perform SLS for >10 sec to demonstrate significant improvement in static balance and ability to wash his LE's in the shower.    Baseline 8 secs B SLS   Time 6   Period Weeks   Status New     PT LONG TERM GOAL #5   Title Patient will score <20 on the DHI to demonstrate significant improvement in dizziness   Baseline DHI: 54   Time 6   Period Weeks   Status New               Plan - 01/29/17 1304    Clinical Impression Statement Pt reporting frustration due to his fluctuating dizziness symptoms. He states that at times he has dizziness that lasts for up to 1 hour after he performs the VOR exercise. Discussed modification of exercise speed so that symptoms last no longer than 20 minutes following completion of his exercises.  With discussion he reports that he did start to increase his gabapentin at his doctor's advised last Thursday and he started having worsening dizziness on Friday. Advised him to monitor his symptoms since increasing the gabapentin and communicate with his MD if he believes it is  contributing to his dizziness. Continue additional balance exercises. Pt encouraged to follow-up as scheduled.    Rehab Potential Fair   Clinical Impairments Affecting Rehab Potential (+) highly motivated, faimly support (-) Symptoms evolving, >3 coormorbitites   PT Frequency 2x / week   PT Duration 6 weeks   PT Treatment/Interventions ADLs/Self Care Home Management;Gait training;Stair training;Functional mobility training;Therapeutic activities;Balance training;Therapeutic exercise;Patient/family education;Neuromuscular re-education;Canalith Repostioning;Energy conservation  PT Next Visit Plan Habituation interventions for dizziness with head turns and VOR exercises. Additional balance exercises as appropriate   PT Home Exercise Plan VOR x 1 in standing with feet apart, plain background 60s x 3, 4x/day, Semitandem balance with horizontal head turns 30s/each LE x 3 each, 3 times/day;   Consulted and Agree with Plan of Care Patient      Patient will benefit from skilled therapeutic intervention in order to improve the following deficits and impairments:  Abnormal gait, Decreased balance, Decreased activity tolerance, Decreased endurance, Decreased strength, Decreased coordination, Difficulty walking, Decreased mobility, Dizziness, Increased fascial restricitons  Visit Diagnosis: Unsteadiness on feet  Unspecified lack of coordination  Muscle weakness (generalized)     Problem List Patient Active Problem List   Diagnosis Date Noted  . Barrett esophagus 11/28/2016  . Celiac disease 11/28/2016  . GERD (gastroesophageal reflux disease) 11/28/2016  . Sleep apnea 11/28/2016  . History of aortic valve replacement 09/25/2016  . Palpitations 09/09/2016  . Encounter for management of temporary pacemaker 08/29/2016  . Metabolic acidosis 08/29/2016  . S/P aortic valve replacement with bioprosthetic valve 08/29/2016  . Leg length discrepancy 04/06/2015  . Tendinopathy of left gluteus medius  02/25/2015  . Left hip pain 12/22/2014  . Spondylosis of lumbar region without myelopathy or radiculopathy 12/22/2014  . COPD with emphysema (HCC) 12/09/2014  . Stenosis of lumbosacral spine 11/10/2014  . 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 AS (aortic stenosis) 09/22/2013  . Bursitis of left hip 02/02/2013  . Atherosclerosis of native artery of extremity with intermittent claudication (HCC) 12/28/2012  . Facet arthritis of lumbar region (HCC) 12/01/2012  . DDD (degenerative disc disease), lumbar 11/23/2012  . Low back pain 11/23/2012  . Dyslipidemia 04/09/2012  . Hypertension 04/09/2012  . Paroxysmal atrial fibrillation (HCC) 04/09/2012  . Pulmonary nodule 12/05/2011   Lynnea Maizes PT, DPT   Jeremy Mason 01/29/2017, 1:53 PM  [place] [facility] MAIN [facility] SERVICES [address] [place], [place], [address] Phone: [phone]   Fax:  [phone]  Name: Jeremy Mason MRN: [id number] Date of Birth: 16-Nov-1940

## 2017-01-31 ENCOUNTER — Ambulatory Visit: Payer: Medicare Other

## 2017-01-31 DIAGNOSIS — M6281 Muscle weakness (generalized): Secondary | ICD-10-CM

## 2017-01-31 DIAGNOSIS — R279 Unspecified lack of coordination: Secondary | ICD-10-CM

## 2017-01-31 DIAGNOSIS — R2681 Unsteadiness on feet: Secondary | ICD-10-CM | POA: Diagnosis not present

## 2017-01-31 NOTE — Therapy (Signed)
[facility] MAIN [facility] SERVICES [address] [place], [place], [address] Phone: [phone]   Fax:  [phone]  Physical Therapy Treatment  Patient Details  Name: Jeremy Mason MRN: [id number] Date of Birth: Dec 17, 1940 Referring Provider: Cristopher Peru MD  Encounter Date: 01/31/2017      PT End of Session - 01/31/17 0958    Visit Number 7   Number of Visits 12   Date for PT Re-Evaluation 02/07/17   Authorization Type 7/10 G code   PT Start Time 0910   PT Stop Time 0955   PT Time Calculation (min) 45 min   Equipment Utilized During Treatment Gait belt   Activity Tolerance Patient tolerated treatment well   Behavior During Therapy [facility] for tasks assessed/performed      Past Medical History:  Diagnosis Date  . Aortic aneurysm (HCC)   . Aortic stenosis   . Arthritis    hips, legs  . Atrial fibrillation (HCC)   . Barrett's esophagus   . Celiac disease   . COPD (chronic obstructive pulmonary disease) (HCC)   . GERD (gastroesophageal reflux disease)   . Heart murmur   . History of hiatal hernia   . Hypertension   . PONV (postoperative nausea and vomiting)    nausea after cataract  . Sleep apnea    no CPAP  . Vertigo    no episodes - 2-3 yrs    Past Surgical History:  Procedure Laterality Date  . CARDIAC CATHETERIZATION    . CARDIAC ELECTROPHYSIOLOGY STUDY AND ABLATION    . CARDIOVERSION    . CATARACT EXTRACTION W/PHACO Right 03/13/2016   Procedure: CATARACT EXTRACTION PHACO AND INTRAOCULAR LENS PLACEMENT (IOC);  Surgeon: Lockie Mola, MD;  Location: [facility];  Service: Ophthalmology;  Laterality: Right;  sleep apnea - no CPAP  . CATARACT EXTRACTION W/PHACO Left 04/10/2016   Procedure: CATARACT EXTRACTION PHACO AND INTRAOCULAR LENS PLACEMENT (IOC) left eye;  Surgeon: Lockie Mola, MD;  Location: [facility];  Service: Ophthalmology;  Laterality: Left;  . COLONOSCOPY    . TUMOR EXCISION     hip  bone    There were no vitals filed for this visit.      Subjective Assessment - 01/31/17 0956    Subjective Patient reports he has not had the increased dizziness episode since the previous treatment. Patient states dereased symptoms today and only reports a very slight amount of dizziness. Patient does not report any pain.    Pertinent History R sided Hip Surgery surgery in 1974 with lead to decreased activation of R hip musculature, Heart valve replacement   Limitations Lifting   Patient Stated Goals To improve balance   Currently in Pain? No/denies      TREATMENT:   Neuromuscular Rehab: Tandem Ambulation -- 4 x 8ft Tandem head turns left/right; up/down - 4 x 11ft  Walking backward/forward head turns Right/left with eye tracking a ball when ambulating - 4 x 66ft each direction Ball toss up/down forward & with head follows - 4 x 18ft each direction Head circles with walking straight with focus on eye fixation on ball out in front - 4 x 34ft Bosu ball standing on black side/blue side - head turns with eye fixation up/down, left/right - x25; Bosu balance with eyes closed - 2 x 1.5 min  Patient responds well to therapy without drastic change to dizziness symptoms throughout session.        PT Education - 01/31/17 3244  Education provided Yes   Education Details FOrm/technique with exercises   Person(s) Educated Patient   Methods Explanation;Demonstration   Comprehension Verbalized understanding;Returned demonstration             PT Long Term Goals - 12/27/16 1135      PT LONG TERM GOAL #1   Title Patient will be independent with HEP to demonstrate improvement after discharge from physical therapy   Baseline Dependent in form/technique   Time 6   Period Weeks   Status New     PT LONG TERM GOAL #2   Title Patient will score a 24/24 on the DGI to demonstrate significant improvement in gait stability when ambulating   Baseline 23/24 DGI   Time 6   Period Weeks    Status New     PT LONG TERM GOAL #3   Title Patient will be able to walk in tandem for 8520ft to demonstrate significant improvement in dynamic balance and improved standing balance.   Baseline Patient steps out for support 7 times during test   Time 6   Period Weeks   Status New     PT LONG TERM GOAL #4   Title Patient will be able to perform SLS for >10 sec to demonstrate significant improvement in static balance and ability to wash his LE's in the shower.    Baseline 8 secs B SLS   Time 6   Period Weeks   Status New     PT LONG TERM GOAL #5   Title Patient will score <20 on the DHI to demonstrate significant improvement in dizziness   Baseline DHI: 54   Time 6   Period Weeks   Status New               Plan - 01/31/17 1128    Clinical Impression Statement Patient demonstrates improvement with VOR activity and head follows when ambulating indicating functional carryover between visits. Patient Demonstrates improvement in static balance, however demonstrates increased postural sway and intermittent UE usage when his eyes are closed indicating vestibular and somatosensory dysfunction. Patient is making progress towards long term goals and will benefit from further skilled therapy to return to prior level of function.    Rehab Potential Fair   Clinical Impairments Affecting Rehab Potential (+) highly motivated, faimly support (-) Symptoms evolving, >3 coormorbitites   PT Frequency 2x / week   PT Duration 6 weeks   PT Treatment/Interventions ADLs/Self Care Home Management;Gait training;Stair training;Functional mobility training;Therapeutic activities;Balance training;Therapeutic exercise;Patient/family education;Neuromuscular re-education;Canalith Repostioning;Energy conservation   PT Next Visit Plan Habituation interventions for dizziness with head turns and VOR exercises. Additional balance exercises as appropriate   PT Home Exercise Plan VOR x 1 in standing with feet apart,  plain background 60s x 3, 4x/day, Semitandem balance with horizontal head turns 30s/each LE x 3 each, 3 times/day;   Consulted and Agree with Plan of Care Patient      Patient will benefit from skilled therapeutic intervention in order to improve the following deficits and impairments:  Abnormal gait, Decreased balance, Decreased activity tolerance, Decreased endurance, Decreased strength, Decreased coordination, Difficulty walking, Decreased mobility, Dizziness, Increased fascial restricitons  Visit Diagnosis: Unsteadiness on feet  Unspecified lack of coordination  Muscle weakness (generalized)     Problem List Patient Active Problem List   Diagnosis Date Noted  . Barrett esophagus 11/28/2016  . Celiac disease 11/28/2016  . GERD (gastroesophageal reflux disease) 11/28/2016  . Sleep apnea 11/28/2016  . History of aortic  valve replacement 09/25/2016  . Palpitations 09/09/2016  . Encounter for management of temporary pacemaker 08/29/2016  . Metabolic acidosis 08/29/2016  . S/P aortic valve replacement with bioprosthetic valve 08/29/2016  . Leg length discrepancy 04/06/2015  . Tendinopathy of left gluteus medius 02/25/2015  . Left hip pain 12/22/2014  . Spondylosis of lumbar region without myelopathy or radiculopathy 12/22/2014  . COPD with emphysema (HCC) 12/09/2014  . Stenosis of lumbosacral spine 11/10/2014  . 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 AS (aortic stenosis) 09/22/2013  . Bursitis of left hip 02/02/2013  . Atherosclerosis of native artery of extremity with intermittent claudication (HCC) 12/28/2012  . Facet arthritis of lumbar region (HCC) 12/01/2012  . DDD (degenerative disc disease), lumbar 11/23/2012  . Low back pain 11/23/2012  . Dyslipidemia 04/09/2012  . Hypertension 04/09/2012  . Paroxysmal atrial fibrillation (HCC) 04/09/2012  . Pulmonary nodule 12/05/2011    Myrene Galas, PT DPT 01/31/2017, 11:31  AM  [facility] MAIN [facility] SERVICES [address] [place], [place], [address] Phone: [phone]   Fax:  [phone]  Name: Jeremy Mason MRN: [id number] Date of Birth: 06-18-1941

## 2017-02-03 ENCOUNTER — Ambulatory Visit: Payer: Medicare Other | Admitting: Physical Therapy

## 2017-02-07 ENCOUNTER — Ambulatory Visit: Payer: Medicare Other

## 2017-02-10 ENCOUNTER — Ambulatory Visit: Payer: Medicare Other

## 2017-02-11 ENCOUNTER — Ambulatory Visit: Payer: Medicare Other | Admitting: Physical Therapy

## 2017-02-12 ENCOUNTER — Ambulatory Visit: Payer: Medicare Other | Admitting: Physical Therapy

## 2017-02-12 ENCOUNTER — Ambulatory Visit: Payer: Medicare Other

## 2017-02-12 VITALS — BP 139/74 | HR 64

## 2017-02-12 DIAGNOSIS — R2681 Unsteadiness on feet: Secondary | ICD-10-CM | POA: Diagnosis not present

## 2017-02-12 DIAGNOSIS — R279 Unspecified lack of coordination: Secondary | ICD-10-CM

## 2017-02-12 NOTE — Therapy (Signed)
[facility] MAIN [facility] SERVICES [address] [place], [place], [address] Phone: [phone]   Fax:  [phone]  Physical Therapy Treatment  Patient Details  Name: Jeremy Mason MRN: [id number] Date of Birth: 1940/10/11 Referring Provider: Cristopher Peru MD  Encounter Date: 02/12/2017      PT End of Session - 02/12/17 1022    Visit Number 8   Number of Visits 12   Date for PT Re-Evaluation 03/26/17   Authorization Type 1/10 G code   PT Start Time 1018   PT Stop Time 1100   PT Time Calculation (min) 42 min   Equipment Utilized During Treatment Gait belt   Activity Tolerance Patient tolerated treatment well   Behavior During Therapy [facility] for tasks assessed/performed      Past Medical History:  Diagnosis Date  . Aortic aneurysm (HCC)   . Aortic stenosis   . Arthritis    hips, legs  . Atrial fibrillation (HCC)   . Barrett's esophagus   . Celiac disease   . COPD (chronic obstructive pulmonary disease) (HCC)   . GERD (gastroesophageal reflux disease)   . Heart murmur   . History of hiatal hernia   . Hypertension   . PONV (postoperative nausea and vomiting)    nausea after cataract  . Sleep apnea    no CPAP  . Vertigo    no episodes - 2-3 yrs    Past Surgical History:  Procedure Laterality Date  . CARDIAC CATHETERIZATION    . CARDIAC ELECTROPHYSIOLOGY STUDY AND ABLATION    . CARDIOVERSION    . CATARACT EXTRACTION W/PHACO Right 03/13/2016   Procedure: CATARACT EXTRACTION PHACO AND INTRAOCULAR LENS PLACEMENT (IOC);  Surgeon: Lockie Mola, MD;  Location: [facility];  Service: Ophthalmology;  Laterality: Right;  sleep apnea - no CPAP  . CATARACT EXTRACTION W/PHACO Left 04/10/2016   Procedure: CATARACT EXTRACTION PHACO AND INTRAOCULAR LENS PLACEMENT (IOC) left eye;  Surgeon: Lockie Mola, MD;  Location: [facility];  Service: Ophthalmology;  Laterality: Left;  . COLONOSCOPY    . TUMOR EXCISION     hip  bone    Vitals:   02/12/17 1022  BP: 139/74  Pulse: 64  SpO2: 99%        Subjective Assessment - 02/12/17 1021    Subjective Pt reports that he still has some good days and some bad days. Exercises are going well. He reports that some days the exercises aggravate his symptoms more than others. He stopped his gabapentin and has noticed an improvement in the frequency of the dizziness.   Pertinent History R sided Hip Surgery surgery in 1974 with lead to decreased activation of R hip musculature, Heart valve replacement   Limitations Lifting   Patient Stated Goals To improve balance   Currently in Pain? No/denies            [facility] PT Assessment - 02/12/17 1036      Observation/Other Assessments   Other Surveys  Other Surveys   Dizziness Handicap Inventory [facility])  24     Standardized Balance Assessment   Standardized Balance Assessment Dynamic Gait Index;Timed Up and Go Test;Five Times Sit to Stand;10 meter walk test;Vestibular Evaluation     Dynamic Gait Index   Level Surface Normal   Change in Gait Speed Normal   Gait with Horizontal Head Turns Normal   Gait with Vertical Head Turns Normal   Gait and Pivot Turn Normal   Step Over Obstacle Normal  Step Around Obstacles Normal   Steps Normal   Total Score 24          TREATMENT:  Neuromuscular Re-education  History obtained from patient regarding his symptoms; Outcome measures DGI, single leg balance, tandem gait; Updated goals with patient and discussed plan of care; Pt completed DHI (unbilled);  VOR x 1 VOR x 1 in standing with feet together with patterned background 60s x 3, cervical motion is slow but pt understands intent and this is his maximum speed. 2/10 dizziness reported with exercise;  Hallway Ambulation Forward ambulation in hallway with ball toss first vertical, then laterally, and then bouncing, performed in 7375' trials; Forward ambulation in hallway with ball toss over shoulder to therapist  with return catch on opposite shoulder 75' x 2;  Discussed common headache triggers with patient as well as food diary.                    PT Education - 02/12/17 1022    Education provided Yes   Education Details Plan of care   Person(s) Educated Patient   Methods Explanation   Comprehension Verbalized understanding             PT Long Term Goals - 02/12/17 1023      PT LONG TERM GOAL #1   Title Patient will be independent with HEP to demonstrate improvement after discharge from physical therapy   Baseline Dependent in form/technique   Time 6   Period Weeks   Status On-going     PT LONG TERM GOAL #2   Title Patient will score a 24/24 on the DGI to demonstrate significant improvement in gait stability when ambulating   Baseline 23/24 DGI; 02/12/17: 24/24   Time 6   Period Weeks   Status Achieved     PT LONG TERM GOAL #3   Title Patient will be able to walk in tandem for 1720ft to demonstrate significant improvement in dynamic balance and improved standing balance.   Baseline Patient steps out for support 7 times during test; 02/12/17: >20' without touching   Time 6   Period Weeks   Status Achieved     PT LONG TERM GOAL #4   Title Patient will be able to perform SLS for >10 sec to demonstrate significant improvement in static balance and ability to wash his LE's in the shower.    Baseline 8 secs B SLS; 02/12/17: 14.4s L/>30s R;    Time 6   Period Weeks   Status Achieved     PT LONG TERM GOAL #5   Title Patient will score <20 on the DHI to demonstrate significant improvement in dizziness   Baseline DHI: 54; 02/12/17: 24/100   Time 6   Period Weeks   Status On-going               Plan - 02/12/17 1023    Clinical Impression Statement Pt demonstrates excellent improvement since first starting therapy. He reports that the frequency of his dizziness symptoms are decreasing however the severity remains the same when he has an episode. He still has to  move slower than he would have before his symptoms first started. His DGI improved to 24/24 and his DHI decreased from 54/100 to 24/100, all of which indicate improvement in his symptoms. Single leg balance also improved from 8 seconds to over 30 seconds. Pt continues to report symptoms of dizziness which are improving with therapy. He would continue to benefit from skilled PT  services to address remaining deficits in order to further decrease symptoms and improve function at home.    Rehab Potential Fair   Clinical Impairments Affecting Rehab Potential (+) highly motivated, faimly support (-) Symptoms evolving, >3 coormorbitites   PT Frequency 2x / week   PT Duration 6 weeks   PT Treatment/Interventions ADLs/Self Care Home Management;Gait training;Stair training;Functional mobility training;Therapeutic activities;Balance training;Therapeutic exercise;Patient/family education;Neuromuscular re-education;Canalith Repostioning;Energy conservation   PT Next Visit Plan Progress HEP, Habituation interventions for dizziness with head turns and VOR exercises. Additional balance exercises as appropriate   PT Home Exercise Plan VOR x 1 in standing with feet apart, plain background 60s x 3, 4x/day, Semitandem balance with horizontal head turns 30s/each LE x 3 each, 3 times/day;   Consulted and Agree with Plan of Care Patient      Patient will benefit from skilled therapeutic intervention in order to improve the following deficits and impairments:  Abnormal gait, Decreased balance, Decreased activity tolerance, Decreased endurance, Decreased strength, Decreased coordination, Difficulty walking, Decreased mobility, Dizziness, Increased fascial restricitons  Visit Diagnosis: Unsteadiness on feet  Unspecified lack of coordination       G-Codes - 03-12-17 1303    Functional Assessment Tool Used (Outpatient Only) DGI, DHI, SLS, Tandem Amb, clinical judgement   Functional Limitation Mobility: Walking and  moving around   Mobility: Walking and Moving Around Current Status [phone]) At least 1 percent but less than 20 percent impaired, limited or restricted   Mobility: Walking and Moving Around Goal Status [phone]) At least 1 percent but less than 20 percent impaired, limited or restricted      Problem List Patient Active Problem List   Diagnosis Date Noted  . Barrett esophagus 11/28/2016  . Celiac disease 11/28/2016  . GERD (gastroesophageal reflux disease) 11/28/2016  . Sleep apnea 11/28/2016  . History of aortic valve replacement 09/25/2016  . Palpitations 09/09/2016  . Encounter for management of temporary pacemaker 08/29/2016  . Metabolic acidosis 08/29/2016  . S/P aortic valve replacement with bioprosthetic valve 08/29/2016  . Leg length discrepancy 04/06/2015  . Tendinopathy of left gluteus medius 02/25/2015  . Left hip pain 12/22/2014  . Spondylosis of lumbar region without myelopathy or radiculopathy 12/22/2014  . COPD with emphysema (HCC) 12/09/2014  . Stenosis of lumbosacral spine 11/10/2014  . 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 AS (aortic stenosis) 09/22/2013  . Bursitis of left hip 02/02/2013  . Atherosclerosis of native artery of extremity with intermittent claudication (HCC) 12/28/2012  . Facet arthritis of lumbar region (HCC) 12/01/2012  . DDD (degenerative disc disease), lumbar 11/23/2012  . Low back pain 11/23/2012  . Dyslipidemia 04/09/2012  . Hypertension 04/09/2012  . Paroxysmal atrial fibrillation (HCC) 04/09/2012  . Pulmonary nodule 12/05/2011   Lynnea Maizes PT, DPT   Reilley Valentine March 12, 2017, 1:06 PM  [facility] MAIN [facility] SERVICES [address] [place], [place], [address] Phone: [phone]   Fax:  [phone]  Name: Jeremy Mason MRN: [id number] Date of Birth: [birth year]

## 2017-02-13 ENCOUNTER — Ambulatory Visit: Payer: Medicare Other | Admitting: Physical Therapy

## 2017-02-14 ENCOUNTER — Ambulatory Visit: Payer: Medicare Other

## 2017-02-18 ENCOUNTER — Ambulatory Visit: Payer: Medicare Other | Admitting: Physical Therapy

## 2017-02-19 ENCOUNTER — Ambulatory Visit: Payer: Medicare Other

## 2017-02-19 DIAGNOSIS — R279 Unspecified lack of coordination: Secondary | ICD-10-CM

## 2017-02-19 DIAGNOSIS — R2681 Unsteadiness on feet: Secondary | ICD-10-CM

## 2017-02-19 DIAGNOSIS — M6281 Muscle weakness (generalized): Secondary | ICD-10-CM

## 2017-02-19 NOTE — Therapy (Signed)
[place] [facility] MAIN [facility] SERVICES [address] [place], [place], [address] Phone: [phone]   Fax:  [phone]  Physical Therapy Treatment  Patient Details  Name: Jeremy Mason MRN: [id number] Date of Birth: November 03, 1940 Referring Provider: Cristopher Peru MD  Encounter Date: 02/19/2017      PT End of Session - 02/19/17 0935    Visit Number 9   Number of Visits 12   Date for PT Re-Evaluation 03/26/17   Authorization Type 2/10 G code   PT Start Time 0844   PT Stop Time 0929   PT Time Calculation (min) 45 min   Equipment Utilized During Treatment Gait belt   Activity Tolerance Patient tolerated treatment well   Behavior During Therapy [facility] for tasks assessed/performed      Past Medical History:  Diagnosis Date  . Aortic aneurysm (HCC)   . Aortic stenosis   . Arthritis    hips, legs  . Atrial fibrillation (HCC)   . Barrett's esophagus   . Celiac disease   . COPD (chronic obstructive pulmonary disease) (HCC)   . GERD (gastroesophageal reflux disease)   . Heart murmur   . History of hiatal hernia   . Hypertension   . PONV (postoperative nausea and vomiting)    nausea after cataract  . Sleep apnea    no CPAP  . Vertigo    no episodes - 2-3 yrs    Past Surgical History:  Procedure Laterality Date  . CARDIAC CATHETERIZATION    . CARDIAC ELECTROPHYSIOLOGY STUDY AND ABLATION    . CARDIOVERSION    . CATARACT EXTRACTION W/PHACO Right 03/13/2016   Procedure: CATARACT EXTRACTION PHACO AND INTRAOCULAR LENS PLACEMENT (IOC);  Surgeon: Lockie Mola, MD;  Location: [facility];  Service: Ophthalmology;  Laterality: Right;  sleep apnea - no CPAP  . CATARACT EXTRACTION W/PHACO Left 04/10/2016   Procedure: CATARACT EXTRACTION PHACO AND INTRAOCULAR LENS PLACEMENT (IOC) left eye;  Surgeon: Lockie Mola, MD;  Location: [facility];  Service: Ophthalmology;  Laterality: Left;  . COLONOSCOPY    . TUMOR EXCISION     hip  bone    There were no vitals filed for this visit. Neuro Re-ed     Subjective Assessment - 02/19/17 0847    Subjective Pt. reports that today is a good day, with less dizziness. He had a few stumbles but no falls since last visit.    Pertinent History R sided Hip Surgery surgery in 1974 with lead to decreased activation of R hip musculature, Heart valve replacement   Limitations Lifting   Patient Stated Goals To improve balance   Currently in Pain? Yes   Pain Score 4    Pain Location Head     VORx1 in standing 60sx3, dizziness increased by 1-2. Head circles eyes closed-increased 2-3, 2x30 sec Ambulating in hallway: ball toss vertically, laterally, bouncing, 75' trials Ball toss over shoulder to therapist Tandem ambulation,  Tandem head turns LOB, didn't make dizzy Walking back/forward. No dizziness Bosu ball standing 2x60 seconds Airex pad tandem stance 2x60 seconds        PT Long Term Goals - 02/12/17 1023      PT LONG TERM GOAL #1   Title Patient will be independent with HEP to demonstrate improvement after discharge from physical therapy   Baseline Dependent in form/technique   Time 6   Period Weeks   Status On-going     PT LONG TERM GOAL #2   Title Patient will  score a 24/24 on the DGI to demonstrate significant improvement in gait stability when ambulating   Baseline 23/24 DGI; 02/12/17: 24/24   Time 6   Period Weeks   Status Achieved     PT LONG TERM GOAL #3   Title Patient will be able to walk in tandem for 4920ft to demonstrate significant improvement in dynamic balance and improved standing balance.   Baseline Patient steps out for support 7 times during test; 02/12/17: >20' without touching   Time 6   Period Weeks   Status Achieved     PT LONG TERM GOAL #4   Title Patient will be able to perform SLS for >10 sec to demonstrate significant improvement in static balance and ability to wash his LE's in the shower.    Baseline 8 secs B SLS; 02/12/17: 14.4s  L/>30s R;    Time 6   Period Weeks   Status Achieved     PT LONG TERM GOAL #5   Title Patient will score <20 on the DHI to demonstrate significant improvement in dizziness   Baseline DHI: 54; 02/12/17: 24/100   Time 6   Period Weeks   Status On-going               Plan - 02/19/17 0934    Clinical Impression Statement Patient presents to therapy with improved balance with dynamic head turns. Pt. is challenged by uneven surfaces such as bosu ball but benefitted from repetition for improved performance. Circular head turns with eyes open/closed increased dizziness>vertical or horizontal. Pt. demonstrates ability to ambulate, multitask, and head turn with occasional LOB. Pt. will continue to benefit from skilled physical therapy to improve balance, reduce risk of falls, and return to previous level of function.    Rehab Potential Fair   Clinical Impairments Affecting Rehab Potential (+) highly motivated, faimly support (-) Symptoms evolving, >3 coormorbitites   PT Frequency 2x / week   PT Duration 6 weeks   PT Treatment/Interventions ADLs/Self Care Home Management;Gait training;Stair training;Functional mobility training;Therapeutic activities;Balance training;Therapeutic exercise;Patient/family education;Neuromuscular re-education;Canalith Repostioning;Energy conservation   PT Next Visit Plan Progress HEP, Habituation interventions for dizziness with head turns and VOR exercises. Additional balance exercises as appropriate   PT Home Exercise Plan VOR x 1 in standing with feet apart, plain background 60s x 3, 4x/day, Semitandem balance with horizontal head turns 30s/each LE x 3 each, 3 times/day;   Consulted and Agree with Plan of Care Patient      Patient will benefit from skilled therapeutic intervention in order to improve the following deficits and impairments:  Abnormal gait, Decreased balance, Decreased activity tolerance, Decreased endurance, Decreased strength, Decreased  coordination, Difficulty walking, Decreased mobility, Dizziness, Increased fascial restricitons  Visit Diagnosis: Unsteadiness on feet  Unspecified lack of coordination  Muscle weakness (generalized)     Problem List Patient Active Problem List   Diagnosis Date Noted  . Barrett esophagus 11/28/2016  . Celiac disease 11/28/2016  . GERD (gastroesophageal reflux disease) 11/28/2016  . Sleep apnea 11/28/2016  . History of aortic valve replacement 09/25/2016  . Palpitations 09/09/2016  . Encounter for management of temporary pacemaker 08/29/2016  . Metabolic acidosis 08/29/2016  . S/P aortic valve replacement with bioprosthetic valve 08/29/2016  . Leg length discrepancy 04/06/2015  . Tendinopathy of left gluteus medius 02/25/2015  . Left hip pain 12/22/2014  . Spondylosis of lumbar region without myelopathy or radiculopathy 12/22/2014  . COPD with emphysema (HCC) 12/09/2014  . Stenosis of lumbosacral spine 11/10/2014  .  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 AS (aortic stenosis) 09/22/2013  . Bursitis of left hip 02/02/2013  . Atherosclerosis of native artery of extremity with intermittent claudication (HCC) 12/28/2012  . Facet arthritis of lumbar region (HCC) 12/01/2012  . DDD (degenerative disc disease), lumbar 11/23/2012  . Low back pain 11/23/2012  . Dyslipidemia 04/09/2012  . Hypertension 04/09/2012  . Paroxysmal atrial fibrillation (HCC) 04/09/2012  . Pulmonary nodule 12/05/2011   Precious Bard, PT, DPT   02/19/2017, 9:37 AM  [place] [facility] MAIN [facility] SERVICES [address] [place], [place], [address] Phone: [phone]   Fax:  [phone]  Name: Jeremy Mason MRN: [id number] Date of Birth: July 10, 1941

## 2017-02-20 ENCOUNTER — Ambulatory Visit: Payer: Medicare Other

## 2017-02-20 DIAGNOSIS — R2681 Unsteadiness on feet: Secondary | ICD-10-CM | POA: Diagnosis not present

## 2017-02-20 DIAGNOSIS — R279 Unspecified lack of coordination: Secondary | ICD-10-CM

## 2017-02-20 DIAGNOSIS — M6281 Muscle weakness (generalized): Secondary | ICD-10-CM

## 2017-02-20 NOTE — Therapy (Signed)
[place] [facility] MAIN [facility] SERVICES [address] [place], [place], [address] Phone: [phone]   Fax:  [phone]  Physical Therapy Treatment  Patient Details  Name: Jeremy Mason MRN: [id number] Date of Birth: March 14, 1941 Referring Provider: Cristopher Peru MD  Encounter Date: 02/20/2017      PT End of Session - 02/20/17 0945    Visit Number 10   Number of Visits 12   Date for PT Re-Evaluation 03/26/17   Authorization Type 3/10 G code   PT Start Time 0814   PT Stop Time 0901   PT Time Calculation (min) 47 min   Equipment Utilized During Treatment Gait belt   Activity Tolerance Patient tolerated treatment well   Behavior During Therapy [facility] for tasks assessed/performed      Past Medical History:  Diagnosis Date  . Aortic aneurysm (HCC)   . Aortic stenosis   . Arthritis    hips, legs  . Atrial fibrillation (HCC)   . Barrett's esophagus   . Celiac disease   . COPD (chronic obstructive pulmonary disease) (HCC)   . GERD (gastroesophageal reflux disease)   . Heart murmur   . History of hiatal hernia   . Hypertension   . PONV (postoperative nausea and vomiting)    nausea after cataract  . Sleep apnea    no CPAP  . Vertigo    no episodes - 2-3 yrs    Past Surgical History:  Procedure Laterality Date  . CARDIAC CATHETERIZATION    . CARDIAC ELECTROPHYSIOLOGY STUDY AND ABLATION    . CARDIOVERSION    . CATARACT EXTRACTION W/PHACO Right 03/13/2016   Procedure: CATARACT EXTRACTION PHACO AND INTRAOCULAR LENS PLACEMENT (IOC);  Surgeon: Lockie Mola, MD;  Location: [facility];  Service: Ophthalmology;  Laterality: Right;  sleep apnea - no CPAP  . CATARACT EXTRACTION W/PHACO Left 04/10/2016   Procedure: CATARACT EXTRACTION PHACO AND INTRAOCULAR LENS PLACEMENT (IOC) left eye;  Surgeon: Lockie Mola, MD;  Location: [facility];  Service: Ophthalmology;  Laterality: Left;  . COLONOSCOPY    . TUMOR EXCISION     hip  bone    There were no vitals filed for this visit.      Subjective Assessment - 02/20/17 0816    Subjective Pt. had some dizziness since yesterday's session. Cervical flexion for prolonged times such as at church last night induced some of his dizziness.    Pertinent History R sided Hip Surgery surgery in 1974 with lead to decreased activation of R hip musculature, Heart valve replacement   Limitations Lifting   Patient Stated Goals To improve balance   Currently in Pain? Yes   Pain Score 1      neuro VORx1 in standing 60sx3, dizziness increased by 1-2. Head circles eyes closed-increased 2-3, 2x30 sec Ambulating in hallway: ball toss vertically, laterally, bouncing, 75' trials Ball toss over shoulder to therapist Tandem head turns LOB, didn't make dizzy Bosu ball standing 2x60 seconds Airex pad tandem stance 2x60 seconds Airex balance beam tandem walking 6x, 6x with vertical head nods, horizontal head nods, , side walking 8x Ambulating in hallway squat pick up ball off of cones with circular head turn at each stop, dizziness standing up from squat, 3rd cone worst        PT Education - 02/20/17 0822    Education provided Yes   Education Details body mechanics, head circles at home, proper squatting to pick up objects safely   Person(s) Educated Patient  Methods Explanation;Demonstration   Comprehension Verbalized understanding;Returned demonstration             PT Long Term Goals - 02/12/17 1023      PT LONG TERM GOAL #1   Title Patient will be independent with HEP to demonstrate improvement after discharge from physical therapy   Baseline Dependent in form/technique   Time 6   Period Weeks   Status On-going     PT LONG TERM GOAL #2   Title Patient will score a 24/24 on the DGI to demonstrate significant improvement in gait stability when ambulating   Baseline 23/24 DGI; 02/12/17: 24/24   Time 6   Period Weeks   Status Achieved     PT LONG TERM GOAL #3    Title Patient will be able to walk in tandem for 2320ft to demonstrate significant improvement in dynamic balance and improved standing balance.   Baseline Patient steps out for support 7 times during test; 02/12/17: >20' without touching   Time 6   Period Weeks   Status Achieved     PT LONG TERM GOAL #4   Title Patient will be able to perform SLS for >10 sec to demonstrate significant improvement in static balance and ability to wash his LE's in the shower.    Baseline 8 secs B SLS; 02/12/17: 14.4s L/>30s R;    Time 6   Period Weeks   Status Achieved     PT LONG TERM GOAL #5   Title Patient will score <20 on the DHI to demonstrate significant improvement in dizziness   Baseline DHI: 54; 02/12/17: 24/100   Time 6   Period Weeks   Status On-going               Plan - 02/20/17 0946    Clinical Impression Statement Patient presents to therapy with difficulty with sustained head positions such as at church.  Returning to upright position from squat increased pt. dizziness but was habituated with repetition.  Pt.  has progressed with uneven surfaces such as bosu ball but benefitted from repetition for improved performance. Circular head turns with eyes open/closed increased dizziness>vertical or horizontal. Pt. demonstrates ability to ambulate, multitask, and head turn with occasional LOB.  Smaller ball to follow with eyes increased dizziness. Pt. will continue to benefit from skilled physical therapy to improve balance, reduce risk of falls, and return to previous level of function.    Rehab Potential Fair   Clinical Impairments Affecting Rehab Potential (+) highly motivated, faimly support (-) Symptoms evolving, >3 coormorbitites   PT Frequency 2x / week   PT Duration 6 weeks   PT Treatment/Interventions ADLs/Self Care Home Management;Gait training;Stair training;Functional mobility training;Therapeutic activities;Balance training;Therapeutic exercise;Patient/family education;Neuromuscular  re-education;Canalith Repostioning;Energy conservation   PT Next Visit Plan Progress HEP   PT Home Exercise Plan VOR x 1 in standing with feet apart, plain background 60s x 3, 4x/day, Semitandem balance with horizontal head turns 30s/each LE x 3 each, 3 times/day;   Consulted and Agree with Plan of Care Patient      Patient will benefit from skilled therapeutic intervention in order to improve the following deficits and impairments:  Abnormal gait, Decreased balance, Decreased activity tolerance, Decreased endurance, Decreased strength, Decreased coordination, Difficulty walking, Decreased mobility, Dizziness, Increased fascial restricitons  Visit Diagnosis: Unsteadiness on feet  Unspecified lack of coordination  Muscle weakness (generalized)     Problem List Patient Active Problem List   Diagnosis Date Noted  . Barrett esophagus 11/28/2016  .  Celiac disease 11/28/2016  . GERD (gastroesophageal reflux disease) 11/28/2016  . Sleep apnea 11/28/2016  . History of aortic valve replacement 09/25/2016  . Palpitations 09/09/2016  . Encounter for management of temporary pacemaker 08/29/2016  . Metabolic acidosis 08/29/2016  . S/P aortic valve replacement with bioprosthetic valve 08/29/2016  . Leg length discrepancy 04/06/2015  . Tendinopathy of left gluteus medius 02/25/2015  . Left hip pain 12/22/2014  . Spondylosis of lumbar region without myelopathy or radiculopathy 12/22/2014  . COPD with emphysema (HCC) 12/09/2014  . Stenosis of lumbosacral spine 11/10/2014  . 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 AS (aortic stenosis) 09/22/2013  . Bursitis of left hip 02/02/2013  . Atherosclerosis of native artery of extremity with intermittent claudication (HCC) 12/28/2012  . Facet arthritis of lumbar region (HCC) 12/01/2012  . DDD (degenerative disc disease), lumbar 11/23/2012  . Low back pain 11/23/2012  . Dyslipidemia 04/09/2012  .  Hypertension 04/09/2012  . Paroxysmal atrial fibrillation (HCC) 04/09/2012  . Pulmonary nodule 12/05/2011   Precious Bard, PT, DPT   02/20/2017, 1:07 PM  [place] [facility] MAIN [facility] SERVICES [address] [place], [place], [address] Phone: [phone]   Fax:  [phone]  Name: Jeremy Mason MRN: [id number] Date of Birth: 04/18/1941

## 2017-02-21 ENCOUNTER — Ambulatory Visit: Payer: Medicare Other

## 2017-02-24 ENCOUNTER — Ambulatory Visit: Payer: Medicare Other

## 2017-02-25 ENCOUNTER — Ambulatory Visit: Payer: Medicare Other

## 2017-02-26 ENCOUNTER — Ambulatory Visit: Payer: Medicare Other

## 2017-02-27 ENCOUNTER — Ambulatory Visit: Payer: Medicare Other | Attending: Neurology

## 2017-02-27 DIAGNOSIS — R2681 Unsteadiness on feet: Secondary | ICD-10-CM | POA: Insufficient documentation

## 2017-02-27 DIAGNOSIS — R279 Unspecified lack of coordination: Secondary | ICD-10-CM

## 2017-02-27 DIAGNOSIS — M6281 Muscle weakness (generalized): Secondary | ICD-10-CM | POA: Diagnosis present

## 2017-02-27 NOTE — Patient Instructions (Addendum)
Forward Lean (Sitting)    With straight back, tighten stomach and lean forward at hips so hands reach towards the ground. Breathe out when reaching down, breathe in reaching up. Take two deep breaths at top before repeating another touch down. Repeat _10___ times per set. Do __1__ sets per session. Do _1___ sessions per day.  http://orth.exer.us/1142   Copyright  VHI. All rights reserved.

## 2017-02-27 NOTE — Therapy (Signed)
[facility] [facility] [address] [place], [place], [address] Phone: [phone]   Fax:  [phone]  Physical Therapy Treatment  Patient Details  Name: Jeremy Mason MRN: [id number] Date of Birth: 1940/11/14 Referring Provider: Cristopher PeruHemang Shah MD  Encounter Date: 02/27/2017      PT End of Session - 02/27/17 1154    Visit Number 11   Number of Visits 12   Date for PT Re-Evaluation 03/26/17   Authorization Type 4/10 G code   PT Start Time 0914   PT Stop Time 0959   PT Time Calculation (min) 45 min   Equipment Utilized During Treatment Gait belt   Activity Tolerance Patient tolerated treatment well   Behavior During Therapy [facility] for tasks assessed/performed      Past Medical History:  Diagnosis Date  . Aortic aneurysm (HCC)   . Aortic stenosis   . Arthritis    hips, legs  . Atrial fibrillation (HCC)   . Barrett's esophagus   . Celiac disease   . COPD (chronic obstructive pulmonary disease) (HCC)   . GERD (gastroesophageal reflux disease)   . Heart murmur   . History of hiatal hernia   . Hypertension   . PONV (postoperative nausea and vomiting)    nausea after cataract  . Sleep apnea    no CPAP  . Vertigo    no episodes - 2-3 yrs    Past Surgical History:  Procedure Laterality Date  . CARDIAC CATHETERIZATION    . CARDIAC ELECTROPHYSIOLOGY STUDY AND ABLATION    . CARDIOVERSION    . CATARACT EXTRACTION W/PHACO Right 03/13/2016   Procedure: CATARACT EXTRACTION PHACO AND INTRAOCULAR LENS PLACEMENT (IOC);  Surgeon: Lockie Molahadwick Brasington, MD;  [facility];  Service: Ophthalmology;  Laterality: Right;  sleep apnea - no CPAP  . CATARACT EXTRACTION W/PHACO Left 04/10/2016   Procedure: CATARACT EXTRACTION PHACO AND INTRAOCULAR LENS PLACEMENT (IOC) left eye;  Surgeon: Lockie Molahadwick Brasington, MD;  [facility];  Service: Ophthalmology;  Laterality: Left;  . COLONOSCOPY    . TUMOR EXCISION     hip  bone    There were no vitals filed for this visit. Neuro re-ed     Subjective Assessment - 02/27/17 0916    Subjective Patient had some dizziness at church but no falls or stumbles since last session.    Pertinent History R sided Hip Surgery surgery in 1974 with lead to decreased activation of R hip musculature, Heart valve replacement   Limitations Lifting   Patient Stated Goals To improve balance   Currently in Pain? No/denies     VORx1 in standing 60sx3, dizziness increased by 1-2. Head circles eyes closed-increased 2-3, 2x30 sec Bosu ball standing 2x60 seconds Airex pad tandem stance 2x60 seconds, head rotated to right Airex balance beam tandem walking 6x, 6x with vertical head nods, horizontal head nods, , side walking 8x Ambulating in hallway squat pick up ball off of cones with circular head turn at each stop, dizziness standing up from squat, 3rd cone worst Ambulating in hallway making circles around 6 cones 4x . More dizzy going to left Seated on table forward flexion to ground and return to upright 10x -eyes closed makes worst. Teaches to breath out when reach down and breathe in when raise up to avoid valsava  Ambulating practicing breathing in and out of nose vs mouth to avoid valsalva. Pt. valsava'd initially with squats and circles but improved with repetition and repeated verbal  cues.   Pt. response to medical necessity:  Pt. will continue to benefit from skilled physical therapy to improve balance, reduce risk of falls, and return to previous level of function  .       PT Long Term Goals - 02/12/17 1023      PT LONG TERM GOAL #1   Title Patient will be independent with HEP to demonstrate improvement after discharge from physical therapy   Baseline Dependent in form/technique   Time 6   Period Weeks   Status On-going     PT LONG TERM GOAL #2   Title Patient will score a 24/24 on the DGI to demonstrate significant improvement in gait stability when ambulating    Baseline 23/24 DGI; 02/12/17: 24/24   Time 6   Period Weeks   Status Achieved     PT LONG TERM GOAL #3   Title Patient will be able to walk in tandem for 60ft to demonstrate significant improvement in dynamic balance and improved standing balance.   Baseline Patient steps out for support 7 times during test; 02/12/17: >20' without touching   Time 6   Period Weeks   Status Achieved     PT LONG TERM GOAL #4   Title Patient will be able to perform SLS for >10 sec to demonstrate significant improvement in static balance and ability to wash his LE's in the shower.    Baseline 8 secs B SLS; 02/12/17: 14.4s L/>30s R;    Time 6   Period Weeks   Status Achieved     PT LONG TERM GOAL #5   Title Patient will score <20 on the DHI to demonstrate significant improvement in dizziness   Baseline DHI: 54; 02/12/17: 24/100   Time 6   Period Weeks   Status On-going               Plan - 02/27/17 1158    Clinical Impression Statement Pt gets more dizzy turning to left which involves moving head to the right and sitting in church with head rotated to the right. Rotating head to right in tandem stance increases LOB.  Returning to upright position from squat increased pt. dizziness but was habituated with repetition. Pt. demonstrates ability to ambulate, multitask, and head turn with occasional LOB.  Valsalva maneuver was discouraged with frequent verbal cues for breathing pattern.  Pt. will continue to benefit from skilled physical therapy to improve balance, reduce risk of falls, and return to previous level of function.   Rehab Potential Fair   Clinical Impairments Affecting Rehab Potential (+) highly motivated, faimly support (-) Symptoms evolving, >3 coormorbitites   PT Frequency 2x / week   PT Duration 6 weeks   PT Treatment/Interventions ADLs/Self Care Home Management;Gait training;Stair training;Functional mobility training;Therapeutic activities;Balance training;Therapeutic  exercise;Patient/family education;Neuromuscular re-education;Canalith Repostioning;Energy conservation   PT Next Visit Plan RECERT, decrease to 1x per week   PT Home Exercise Plan VOR x 1 in standing with feet apart, plain background 60s x 3, 4x/day, Semitandem balance with horizontal head turns 30s/each LE x 3 each, 3 times/day;   Consulted and Agree with Plan of Care Patient      Patient will benefit from skilled therapeutic intervention in order to improve the following deficits and impairments:  Abnormal gait, Decreased balance, Decreased activity tolerance, Decreased endurance, Decreased strength, Decreased coordination, Difficulty walking, Decreased mobility, Dizziness, Increased fascial restricitons  Visit Diagnosis: Unsteadiness on feet  Unspecified lack of coordination  Muscle weakness (generalized)  Problem List Patient Active Problem List   Diagnosis Date Noted  . Barrett esophagus 11/28/2016  . Celiac disease 11/28/2016  . GERD (gastroesophageal reflux disease) 11/28/2016  . Sleep apnea 11/28/2016  . History of aortic valve replacement 09/25/2016  . Palpitations 09/09/2016  . Encounter for management of temporary pacemaker 08/29/2016  . Metabolic acidosis 08/29/2016  . S/P aortic valve replacement with bioprosthetic valve 08/29/2016  . Leg length discrepancy 04/06/2015  . Tendinopathy of left gluteus medius 02/25/2015  . Left hip pain 12/22/2014  . Spondylosis of lumbar region without myelopathy or radiculopathy 12/22/2014  . COPD with emphysema (HCC) 12/09/2014  . Stenosis of lumbosacral spine 11/10/2014  . 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 AS (aortic stenosis) 09/22/2013  . Bursitis of left hip 02/02/2013  . Atherosclerosis of native artery of extremity with intermittent claudication (HCC) 12/28/2012  . Facet arthritis of lumbar region (HCC) 12/01/2012  . DDD (degenerative disc disease), lumbar 11/23/2012  .  Low back pain 11/23/2012  . Dyslipidemia 04/09/2012  . Hypertension 04/09/2012  . Paroxysmal atrial fibrillation (HCC) 04/09/2012  . Pulmonary nodule 12/05/2011   Precious Bard, PT, DPT   02/27/2017, 12:00 PM  [facility] [facility] MAIN [facility] SERVICES [address] [place], [place], [address] Phone: [phone]   Fax:  [phone]  Name: Jeremy Mason MRN: [id number] Date of Birth: 10/14/1940

## 2017-03-05 ENCOUNTER — Ambulatory Visit: Payer: Medicare Other

## 2017-03-05 DIAGNOSIS — R2681 Unsteadiness on feet: Secondary | ICD-10-CM

## 2017-03-05 DIAGNOSIS — R279 Unspecified lack of coordination: Secondary | ICD-10-CM

## 2017-03-05 DIAGNOSIS — M6281 Muscle weakness (generalized): Secondary | ICD-10-CM

## 2017-03-05 NOTE — Therapy (Signed)
[place] [facility] [address] [place], [place], [address] Phone: [phone]   Fax:  [phone]  Physical Therapy Treatment  Patient Details  Name: Jeremy Mason MRN: [id number] Date of Birth: Sep 01, 1941 Referring Provider: Cristopher PeruHemang Shah MD  Encounter Date: 03/05/2017      PT End of Session - 03/05/17 1130    Visit Number 12   Number of Visits 24   Date for PT Re-Evaluation 04/16/17   Authorization Type 5/10 G code (will be 1/10 next session)   PT Start Time 0930   PT Stop Time 1015   PT Time Calculation (min) 45 min   Equipment Utilized During Treatment Gait belt   Activity Tolerance Patient tolerated treatment well   Behavior During Therapy [facility] for tasks assessed/performed      Past Medical History:  Diagnosis Date  . Aortic aneurysm (HCC)   . Aortic stenosis   . Arthritis    hips, legs  . Atrial fibrillation (HCC)   . Barrett's esophagus   . Celiac disease   . COPD (chronic obstructive pulmonary disease) (HCC)   . GERD (gastroesophageal reflux disease)   . Heart murmur   . History of hiatal hernia   . Hypertension   . PONV (postoperative nausea and vomiting)    nausea after cataract  . Sleep apnea    no CPAP  . Vertigo    no episodes - 2-3 yrs    Past Surgical History:  Procedure Laterality Date  . CARDIAC CATHETERIZATION    . CARDIAC ELECTROPHYSIOLOGY STUDY AND ABLATION    . CARDIOVERSION    . CATARACT EXTRACTION W/PHACO Right 03/13/2016   Procedure: CATARACT EXTRACTION PHACO AND INTRAOCULAR LENS PLACEMENT (IOC);  Surgeon: Lockie Molahadwick Brasington, MD;  [facility];  Service: Ophthalmology;  Laterality: Right;  sleep apnea - no CPAP  . CATARACT EXTRACTION W/PHACO Left 04/10/2016   Procedure: CATARACT EXTRACTION PHACO AND INTRAOCULAR LENS PLACEMENT (IOC) left eye;  Surgeon: Lockie Molahadwick Brasington, MD;  [facility];  Service: Ophthalmology;  Laterality: Left;  . COLONOSCOPY     . TUMOR EXCISION     hip bone    There were no vitals filed for this visit.      Subjective Assessment - 03/05/17 0932    Subjective Patient reports that he has not had any falls since last session. Has a bad headache this morning.    Pertinent History R sided Hip Surgery surgery in 1974 with lead to decreased activation of R hip musculature, Heart valve replacement   Limitations Lifting   Patient Stated Goals To improve balance   Currently in Pain? Yes   Pain Score 3    Pain Location Head   Pain Orientation Medial;Lateral   Pain Descriptors / Indicators Aching;Discomfort     DHI:36 BERG: 53/56 Neuro Re-ed VORx1 in standing 60sx3, dizziness increased by 1-2. Head circles eyes closed-increased 2-3, 2x30 sec, decreased sway from prior treatment days. Bosu ball standing 2x60 seconds Half foam roller balance 60 seconds Airex pad tandem stance 4x60 seconds, head rotated to right. 1x head to the left Ambulating in hallway squat pick up ball off of cones with circular head turn at each stop, dizziness standing up from squat, 3rd cone worst, 7 cones 4x Ambulating in hallway making circles around 7 cones 4x . More dizzy going to left Seated on table forward flexion to ground and return to upright 10x -eyes closed makes worst. Teaches to breath out when reach  down and breathe in when raise up to avoid valsava  Ambulating practicing breathing in and out of nose vs mouth to avoid valsalva. Pt. valsava'd initially with squats and circles but improved with repetition and repeated verbal cues.    Pt. response to medical necessity:  Patient will benefit from skilled physical therapy services 1x/week. Patient will continue to benefit from skilled physical therapy to improve balance, reduce risk of falls, and return to previous level of function.          PT Long Term Goals - 03/05/17 1027      PT LONG TERM GOAL #1   Title Patient will be independent with HEP to demonstrate improvement  after discharge from physical therapy   Baseline HEP continues to progress,    Time 6   Period Weeks   Status On-going     PT LONG TERM GOAL #2   Title Patient will score a 24/24 on the DGI to demonstrate significant improvement in gait stability when ambulating   Baseline 23/24 DGI; 02/12/17: 24/24   Time 6   Period Weeks   Status Achieved     PT LONG TERM GOAL #3   Title Patient will be able to walk in tandem for 69ft to demonstrate significant improvement in dynamic balance and improved standing balance.   Baseline Patient steps out for support 7 times during test; 02/12/17: >20' without touching   Time 6   Period Weeks   Status Achieved     PT LONG TERM GOAL #4   Title Patient will be able to perform SLS for >10 sec to demonstrate significant improvement in static balance and ability to wash his LE's in the shower.    Baseline 8 secs B SLS; 02/12/17: 14.4s L/>30s R;    Time 6   Period Weeks   Status Achieved     PT LONG TERM GOAL #5   Title Patient will score <20 on the DHI to demonstrate significant improvement in dizziness   Baseline DHI: 54; 02/12/17: 24/100, 6/13; 36/100   Time 6   Period Weeks   Status On-going     Additional Long Term Goals   Additional Long Term Goals Yes     PT LONG TERM GOAL #6   Title Patient will score 56/56 on BERG balance evaluation to improve ability to interact with natural environment in a safe manner    Baseline 6/13: 53/56   Time 6   Period Weeks   Status New               Plan - 03/05/17 1136    Clinical Impression Statement Patient presents to therapy with headache. Pt. Becomes more dizzy when maintaining head position to right or when turning to the left which involves moving head to the right. BERG=53/36, DGI=36. Pt. Had increased dizziness with turning and head movements. Pt. Has good balance on stable surfaces however struggles with dynamic surfaces such as a Bosu at this time. Pt. Demonstrated ability to ambulate,  multitask and head turn with occasional LOB. Picking objects off the floor increases pt.'s dizziness though it has improved with interventions and between sessions. Patient will benefit from skilled physical therapy services 1x/week. Patient will continue to benefit from skilled physical therapy to improve balance, reduce risk of falls, and return to previous level of function.    Rehab Potential Fair   Clinical Impairments Affecting Rehab Potential (+) highly motivated, faimly support (-) Symptoms evolving, >3 coormorbitites   PT Frequency 1x /  week   PT Duration 6 weeks   PT Treatment/Interventions ADLs/Self Care Home Management;Gait training;Stair training;Functional mobility training;Therapeutic activities;Balance training;Therapeutic exercise;Patient/family education;Neuromuscular re-education;Canalith Repostioning;Energy conservation   PT Next Visit Plan head turns, circles   PT Home Exercise Plan VOR x 1 in standing with feet apart, plain background 60s x 3, 4x/day, Semitandem balance with horizontal head turns 30s/each LE x 3 each, 3 times/day;   Consulted and Agree with Plan of Care Patient      Patient will benefit from skilled therapeutic intervention in order to improve the following deficits and impairments:  Abnormal gait, Decreased balance, Decreased activity tolerance, Decreased endurance, Decreased strength, Decreased coordination, Difficulty walking, Decreased mobility, Dizziness, Increased fascial restricitons  Visit Diagnosis: Unsteadiness on feet  Unspecified lack of coordination  Muscle weakness (generalized)       G-Codes - 03-13-2017 1137    Functional Assessment Tool Used (Outpatient Only) DGI, BERG, clinical judgement, SLS, tandem stance   Functional Limitation Mobility: Walking and moving around   Mobility: Walking and Moving Around Current Status [phone]) At least 1 percent but less than 20 percent impaired, limited or restricted   Mobility: Walking and Moving  Around Goal Status [phone]) At least 1 percent but less than 20 percent impaired, limited or restricted      Problem List Patient Active Problem List   Diagnosis Date Noted  . Barrett esophagus 11/28/2016  . Celiac disease 11/28/2016  . GERD (gastroesophageal reflux disease) 11/28/2016  . Sleep apnea 11/28/2016  . History of aortic valve replacement 09/25/2016  . Palpitations 09/09/2016  . Encounter for management of temporary pacemaker 08/29/2016  . Metabolic acidosis 08/29/2016  . S/P aortic valve replacement with bioprosthetic valve 08/29/2016  . Leg length discrepancy 04/06/2015  . Tendinopathy of left gluteus medius 02/25/2015  . Left hip pain 12/22/2014  . Spondylosis of lumbar region without myelopathy or radiculopathy 12/22/2014  . COPD with emphysema (HCC) 12/09/2014  . Stenosis of lumbosacral spine 11/10/2014  . 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 AS (aortic stenosis) 09/22/2013  . Bursitis of left hip 02/02/2013  . Atherosclerosis of native artery of extremity with intermittent claudication (HCC) 12/28/2012  . Facet arthritis of lumbar region (HCC) 12/01/2012  . DDD (degenerative disc disease), lumbar 11/23/2012  . Low back pain 11/23/2012  . Dyslipidemia 04/09/2012  . Hypertension 04/09/2012  . Paroxysmal atrial fibrillation (HCC) 04/09/2012  . Pulmonary nodule 12/05/2011   Precious Bard, PT, DPT   Mar 13, 2017, 11:40 AM  [place] [facility] MAIN [facility] SERVICES [address] [place], [place], [address] Phone: [phone]   Fax:  [phone]  Name: Jeremy Mason MRN: [id number] Date of Birth: Mar 23, 1941

## 2017-03-10 ENCOUNTER — Ambulatory Visit: Payer: Medicare Other

## 2017-03-14 ENCOUNTER — Ambulatory Visit: Payer: Medicare Other

## 2017-03-14 DIAGNOSIS — R2681 Unsteadiness on feet: Secondary | ICD-10-CM | POA: Diagnosis not present

## 2017-03-14 DIAGNOSIS — M6281 Muscle weakness (generalized): Secondary | ICD-10-CM

## 2017-03-14 DIAGNOSIS — R279 Unspecified lack of coordination: Secondary | ICD-10-CM

## 2017-03-14 NOTE — Therapy (Signed)
[place] [facility] [address] [place], [place], [address] Phone: [phone]   Fax:  [phone]  Physical Therapy Treatment  Patient Details  Name: Jeremy Mason MRN: [id number] Date of Birth: 12-06-1940 Referring Provider: Cristopher PeruHemang Shah MD  Encounter Date: 03/14/2017      PT End of Session - 03/14/17 1208    Visit Number 13   Number of Visits 24   Date for PT Re-Evaluation 04/16/17   Authorization Type 1/10   PT Start Time 0913   PT Stop Time 0959   PT Time Calculation (min) 46 min   Equipment Utilized During Treatment Gait belt   Activity Tolerance Patient tolerated treatment well   Behavior [facility] for tasks assessed/performed      Past Medical History:  Diagnosis Date  . Aortic aneurysm (HCC)   . Aortic stenosis   . Arthritis    hips, legs  . Atrial fibrillation (HCC)   . Barrett's esophagus   . Celiac disease   . COPD (chronic obstructive pulmonary disease) (HCC)   . GERD (gastroesophageal reflux disease)   . Heart murmur   . History of hiatal hernia   . Hypertension   . PONV (postoperative nausea and vomiting)    nausea after cataract  . Sleep apnea    no CPAP  . Vertigo    no episodes - 2-3 yrs    Past Surgical History:  Procedure Laterality Date  . CARDIAC CATHETERIZATION    . CARDIAC ELECTROPHYSIOLOGY STUDY AND ABLATION    . CARDIOVERSION    . CATARACT EXTRACTION W/PHACO Right 03/13/2016   Procedure: CATARACT EXTRACTION PHACO AND INTRAOCULAR LENS PLACEMENT (IOC);  Surgeon: Lockie Molahadwick Brasington, MD;  [facility];  Service: Ophthalmology;  Laterality: Right;  sleep apnea - no CPAP  . CATARACT EXTRACTION W/PHACO Left 04/10/2016   Procedure: CATARACT EXTRACTION PHACO AND INTRAOCULAR LENS PLACEMENT (IOC) left eye;  Surgeon: Lockie Molahadwick Brasington, MD;  [facility];  Service: Ophthalmology;  Laterality: Left;  . COLONOSCOPY    . TUMOR EXCISION     hip bone     There were no vitals filed for this visit. TherEx     Subjective Assessment - 03/14/17 0917    Subjective Pt. went to cardiac doctor yesterday and had medication changed due to changing blood pressure. Pt. has been feeling good and been conciencious about breathing while exercising. Pt. stopped taking GAB but went back on it yesterday.    Pertinent History R sided Hip Surgery surgery in 1974 with lead to decreased activation of R hip musculature, Heart valve replacement   Limitations Lifting   Patient Stated Goals To improve balance   Currently in Pain? No/denies     VORx1 in standing 60sx3, dizziness increased by 1. Head circles eyes closed-increased 2-3, 2x30 sec Bosu ball standing 60 seconds, 96 seconds (improved by 30 seconds) Airex eyes closed 2x60 seoncs Half foam roller 60 seconds no balance Ambulating in hallway squat pick up ball off of cones with circular head turn at each stop, side stepping to next one decreased dizziness form prior sessions Ambulating in hallway making circles around 6 cones and squatting at each one.  Airex pad: throwing balls into bucket normal stance 60 seconds, tandem stance 20 balls, tandem increased dizziness to two Seated on table forward flexion to ground and return to upright 15x eyes open -eyes closed 15x . Teaches to breath out when reach down and breathe in when raise  up to avoid valsava     Pt. response to medical necessity:  Pt. will continue to benefit from skilled physical therapy to improve balance, reduce risk of falls, and return to previous level of function          PT Long Term Goals - 03/05/17 1027      PT LONG TERM GOAL #1   Title Patient will be independent with HEP to demonstrate improvement after discharge from physical therapy   Baseline HEP continues to progress,    Time 6   Period Weeks   Status On-going     PT LONG TERM GOAL #2   Title Patient will score a 24/24 on the DGI to demonstrate significant  improvement in gait stability when ambulating   Baseline 23/24 DGI; 02/12/17: 24/24   Time 6   Period Weeks   Status Achieved     PT LONG TERM GOAL #3   Title Patient will be able to walk in tandem for 92ft to demonstrate significant improvement in dynamic balance and improved standing balance.   Baseline Patient steps out for support 7 times during test; 02/12/17: >20' without touching   Time 6   Period Weeks   Status Achieved     PT LONG TERM GOAL #4   Title Patient will be able to perform SLS for >10 sec to demonstrate significant improvement in static balance and ability to wash his LE's in the shower.    Baseline 8 secs B SLS; 02/12/17: 14.4s L/>30s R;    Time 6   Period Weeks   Status Achieved     PT LONG TERM GOAL #5   Title Patient will score <20 on the DHI to demonstrate significant improvement in dizziness   Baseline DHI: 54; 02/12/17: 24/100, 6/13; 36/100   Time 6   Period Weeks   Status On-going     Additional Long Term Goals   Additional Long Term Goals Yes     PT LONG TERM GOAL #6   Title Patient will score 56/56 on BERG balance evaluation to improve ability to interact with natural environment in a safe manner    Baseline 6/13: 53/56   Time 6   Period Weeks   Status New               Plan - 03/14/17 1210    Clinical Impression Statement Patient presented to therapy with improved balance and decreased dizziness. Progressions in balance were implemented with pt. Demonstrating slight increase in dizziness but with quick return to baseline.  Functional activities that challenge pt.'s dizziness were replicated with improvement upon repetition. Pt. will continue to benefit from skilled physical therapy to improve balance, reduce risk of falls, and return to previous level of function   Rehab Potential Fair   Clinical Impairments Affecting Rehab Potential (+) highly motivated, faimly support (-) Symptoms evolving, >3 coormorbitites   PT Frequency 1x / week   PT  Duration 6 weeks   PT Treatment/Interventions ADLs/Self Care Home Management;Gait training;Stair training;Functional mobility training;Therapeutic activities;Balance training;Therapeutic exercise;Patient/family education;Neuromuscular re-education;Canalith Repostioning;Energy conservation   PT Next Visit Plan head turns, circles   PT Home Exercise Plan VOR x 1 in standing with feet apart, plain background 60s x 3, 4x/day, Semitandem balance with horizontal head turns 30s/each LE x 3 each, 3 times/day;   Consulted and Agree with Plan of Care Patient      Patient will benefit from skilled therapeutic intervention in order to improve the following deficits and impairments:  Abnormal gait, Decreased balance, Decreased activity tolerance, Decreased endurance, Decreased strength, Decreased coordination, Difficulty walking, Decreased mobility, Dizziness, Increased fascial restricitons  Visit Diagnosis: Unsteadiness on feet  Unspecified lack of coordination  Muscle weakness (generalized)     Problem List Patient Active Problem List   Diagnosis Date Noted  . Barrett esophagus 11/28/2016  . Celiac disease 11/28/2016  . GERD (gastroesophageal reflux disease) 11/28/2016  . Sleep apnea 11/28/2016  . History of aortic valve replacement 09/25/2016  . Palpitations 09/09/2016  . Encounter for management of temporary pacemaker 08/29/2016  . Metabolic acidosis 08/29/2016  . S/P aortic valve replacement with bioprosthetic valve 08/29/2016  . Leg length discrepancy 04/06/2015  . Tendinopathy of left gluteus medius 02/25/2015  . Left hip pain 12/22/2014  . Spondylosis of lumbar region without myelopathy or radiculopathy 12/22/2014  . COPD with emphysema (HCC) 12/09/2014  . Stenosis of lumbosacral spine 11/10/2014  . 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 AS (aortic stenosis) 09/22/2013  . Bursitis of left hip 02/02/2013  . Atherosclerosis of native  artery of extremity with intermittent claudication (HCC) 12/28/2012  . Facet arthritis of lumbar region (HCC) 12/01/2012  . DDD (degenerative disc disease), lumbar 11/23/2012  . Low back pain 11/23/2012  . Dyslipidemia 04/09/2012  . Hypertension 04/09/2012  . Paroxysmal atrial fibrillation (HCC) 04/09/2012  . Pulmonary nodule 12/05/2011   Precious Bard, PT, DPT   03/14/2017, 12:11 PM  [place] [facility] MAIN [facility] SERVICES [address] [place], [place], [address] Phone: [phone]   Fax:  [phone]  Name: Jeremy Mason MRN: [id number] Date of Birth: 1941-05-27

## 2017-03-18 ENCOUNTER — Ambulatory Visit: Payer: Medicare Other

## 2017-03-18 DIAGNOSIS — R2681 Unsteadiness on feet: Secondary | ICD-10-CM

## 2017-03-18 DIAGNOSIS — R279 Unspecified lack of coordination: Secondary | ICD-10-CM

## 2017-03-18 DIAGNOSIS — M6281 Muscle weakness (generalized): Secondary | ICD-10-CM

## 2017-03-18 NOTE — Therapy (Signed)
[place] [facility] MAIN [facility] SERVICES [address] [place], [place], [address] Phone: [phone]   Fax:  [phone]  Physical Therapy Treatment  Patient Details  Name: Jeremy Mason MRN: [id number] Date of Birth: [birth year] Referring Provider: Cristopher Peru MD  Encounter Date: 03/18/2017      PT End of Session - 03/18/17 0917    Visit Number 14   Number of Visits 24   Date for PT Re-Evaluation 04/16/17   Authorization Type 2/10   PT Start Time 0914   PT Stop Time 1000   PT Time Calculation (min) 46 min   Equipment Utilized During Treatment Gait belt   Activity Tolerance Patient tolerated treatment well   Behavior During Therapy [facility] for tasks assessed/performed      Past Medical History:  Diagnosis Date  . Aortic aneurysm (HCC)   . Aortic stenosis   . Arthritis    hips, legs  . Atrial fibrillation (HCC)   . Barrett's esophagus   . Celiac disease   . COPD (chronic obstructive pulmonary disease) (HCC)   . GERD (gastroesophageal reflux disease)   . Heart murmur   . History of hiatal hernia   . Hypertension   . PONV (postoperative nausea and vomiting)    nausea after cataract  . Sleep apnea    no CPAP  . Vertigo    no episodes - 2-3 yrs    Past Surgical History:  Procedure Laterality Date  . CARDIAC CATHETERIZATION    . CARDIAC ELECTROPHYSIOLOGY STUDY AND ABLATION    . CARDIOVERSION    . CATARACT EXTRACTION W/PHACO Right 03/13/2016   Procedure: CATARACT EXTRACTION PHACO AND INTRAOCULAR LENS PLACEMENT (IOC);  Surgeon: Lockie Mola, MD;  [facility];  Service: Ophthalmology;  Laterality: Right;  sleep apnea - no CPAP  . CATARACT EXTRACTION W/PHACO Left 04/10/2016   Procedure: CATARACT EXTRACTION PHACO AND INTRAOCULAR LENS PLACEMENT (IOC) left eye;  Surgeon: Lockie Mola, MD;  [facility];  Service: Ophthalmology;  Laterality: Left;  . COLONOSCOPY    . TUMOR EXCISION     hip bone     There were no vitals filed for this visit.      Subjective Assessment - 03/18/17 0915    Subjective Pt. presents to therapy with slight increase in headache and dizziness compared to last week. Potenetiallly will be taking an upcoming trip this [place] to the mountains.    Pertinent History R sided Hip Surgery surgery in 1974 with lead to decreased activation of R hip musculature, Heart valve replacement   Limitations Lifting   Patient Stated Goals To improve balance   Currently in Pain? No/denies     VORx1 in standing 60sx3, dizziness increased by 1. Ambulating in hall turning head sideways to read cards Head circles eyes closed-increased 2-3, 2x30 sec Bosu ball standing 60 seconds, >90 seconds (improved by 30 seconds) Airex eyes closed 2x60 seconds 360 cirlces x 4: 1-2 + in dizziness first x, 3x the second airex pad tandem stance (heel to toe) 2x30 seconds no wobbles Ambulating in hallway squat pick up ball off of cones with circular head turn at each stop, side stepping to next one decreased dizziness form prior sessions Ambulating in hallway making circles around 6 cones and squatting at each one.  Side stepping and vertically nodding head between squats at cones. X 3    150/72 (69) Sp02: 99  Pt. response to medical necessity:  Pt. will continue to benefit from skilled  physical therapy to improve balance, reduce risk of falls, and return to previous level of function              PT Education - 03/18/17 0902    Education provided Yes   Education Details balance and coordination with functional tasks   Person(s) Educated Patient   Methods Explanation;Demonstration   Comprehension Verbalized understanding;Returned demonstration             PT Long Term Goals - 03/05/17 1027      PT LONG TERM GOAL #1   Title Patient will be independent with HEP to demonstrate improvement after discharge from physical therapy   Baseline HEP continues to progress,    Time 6    Period Weeks   Status On-going     PT LONG TERM GOAL #2   Title Patient will score a 24/24 on the DGI to demonstrate significant improvement in gait stability when ambulating   Baseline 23/24 DGI; 02/12/17: 24/24   Time 6   Period Weeks   Status Achieved     PT LONG TERM GOAL #3   Title Patient will be able to walk in tandem for 4720ft to demonstrate significant improvement in dynamic balance and improved standing balance.   Baseline Patient steps out for support 7 times during test; 02/12/17: >20' without touching   Time 6   Period Weeks   Status Achieved     PT LONG TERM GOAL #4   Title Patient will be able to perform SLS for >10 sec to demonstrate significant improvement in static balance and ability to wash his LE's in the shower.    Baseline 8 secs B SLS; 02/12/17: 14.4s L/>30s R;    Time 6   Period Weeks   Status Achieved     PT LONG TERM GOAL #5   Title Patient will score <20 on the DHI to demonstrate significant improvement in dizziness   Baseline DHI: 54; 02/12/17: 24/100, 6/13; 36/100   Time 6   Period Weeks   Status On-going     Additional Long Term Goals   Additional Long Term Goals Yes     PT LONG TERM GOAL #6   Title Patient will score 56/56 on BERG balance evaluation to improve ability to interact with natural environment in a safe manner    Baseline 6/13: 53/56   Time 6   Period Weeks   Status New               Plan - 03/18/17 1004    Clinical Impression Statement Patient presents with increased dizziness from last week due to increased headache and medical fluctuations. Patient demonstrating improved ability to recouperate after 360 turn in each direction.  Pt. will continue to benefit from skilled physical therapy to improve balance, reduce risk of falls, and return to previous level of function   Rehab Potential Fair   Clinical Impairments Affecting Rehab Potential (+) highly motivated, faimly support (-) Symptoms evolving, >3 coormorbitites   PT  Frequency 1x / week   PT Duration 6 weeks   PT Treatment/Interventions ADLs/Self Care Home Management;Gait training;Stair training;Functional mobility training;Therapeutic activities;Balance training;Therapeutic exercise;Patient/family education;Neuromuscular re-education;Canalith Repostioning;Energy conservation   PT Next Visit Plan head turns, circles   PT Home Exercise Plan VOR x 1 in standing with feet apart, plain background 60s x 3, 4x/day, Semitandem balance with horizontal head turns 30s/each LE x 3 each, 3 times/day;   Consulted and Agree with Plan of Care Patient  Patient will benefit from skilled therapeutic intervention in order to improve the following deficits and impairments:  Abnormal gait, Decreased balance, Decreased activity tolerance, Decreased endurance, Decreased strength, Decreased coordination, Difficulty walking, Decreased mobility, Dizziness, Increased fascial restricitons  Visit Diagnosis: Unsteadiness on feet  Unspecified lack of coordination  Muscle weakness (generalized)     Problem List Patient Active Problem List   Diagnosis Date Noted  . Barrett esophagus 11/28/2016  . Celiac disease 11/28/2016  . GERD (gastroesophageal reflux disease) 11/28/2016  . Sleep apnea 11/28/2016  . History of aortic valve replacement 09/25/2016  . Palpitations 09/09/2016  . Encounter for management of temporary pacemaker 08/29/2016  . Metabolic acidosis 08/29/2016  . S/P aortic valve replacement with bioprosthetic valve 08/29/2016  . Leg length discrepancy 04/06/2015  . Tendinopathy of left gluteus medius 02/25/2015  . Left hip pain 12/22/2014  . Spondylosis of lumbar region without myelopathy or radiculopathy 12/22/2014  . COPD with emphysema (HCC) 12/09/2014  . Stenosis of lumbosacral spine 11/10/2014  . 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 AS (aortic stenosis) 09/22/2013  . Bursitis of left hip 02/02/2013  .  Atherosclerosis of native artery of extremity with intermittent claudication (HCC) 12/28/2012  . Facet arthritis of lumbar region (HCC) 12/01/2012  . DDD (degenerative disc disease), lumbar 11/23/2012  . Low back pain 11/23/2012  . Dyslipidemia 04/09/2012  . Hypertension 04/09/2012  . Paroxysmal atrial fibrillation (HCC) 04/09/2012  . Pulmonary nodule 12/05/2011   Precious Bard, PT, DPT   03/18/2017, 10:06 AM  [place] [facility] MAIN [facility] SERVICES [address] [place], [place], [address] Phone: [phone]   Fax:  [phone]  Name: Jeremy Mason MRN: [id number] Date of Birth: [birth year]

## 2017-03-25 ENCOUNTER — Ambulatory Visit: Payer: Medicare Other

## 2017-03-27 ENCOUNTER — Ambulatory Visit: Payer: Medicare Other | Attending: Neurology

## 2017-03-27 DIAGNOSIS — R2681 Unsteadiness on feet: Secondary | ICD-10-CM | POA: Diagnosis not present

## 2017-03-27 DIAGNOSIS — M6281 Muscle weakness (generalized): Secondary | ICD-10-CM | POA: Diagnosis present

## 2017-03-27 DIAGNOSIS — R279 Unspecified lack of coordination: Secondary | ICD-10-CM

## 2017-03-27 NOTE — Therapy (Signed)
[facility] MAIN [facility] SERVICES [address] [place], [place], [address] Phone: [phone]   Fax:  [phone]  Physical Therapy Treatment  Patient Details  Name: Jeremy Mason MRN: [id number] Date of Birth: [birth year] Referring Provider: Cristopher Peru MD  Encounter Date: 03/27/2017      PT End of Session - 03/27/17 1235    Visit Number 15   Number of Visits 24   Date for PT Re-Evaluation 04/16/17   Authorization Type 3/10   PT Start Time 1030   PT Stop Time 1115   PT Time Calculation (min) 45 min   Equipment Utilized During Treatment Gait belt   Activity Tolerance Patient tolerated treatment well   Behavior During Therapy [facility] for tasks assessed/performed      Past Medical History:  Diagnosis Date  . Aortic aneurysm (HCC)   . Aortic stenosis   . Arthritis    hips, legs  . Atrial fibrillation (HCC)   . Barrett's esophagus   . Celiac disease   . COPD (chronic obstructive pulmonary disease) (HCC)   . GERD (gastroesophageal reflux disease)   . Heart murmur   . History of hiatal hernia   . Hypertension   . PONV (postoperative nausea and vomiting)    nausea after cataract  . Sleep apnea    no CPAP  . Vertigo    no episodes - 2-3 yrs    Past Surgical History:  Procedure Laterality Date  . CARDIAC CATHETERIZATION    . CARDIAC ELECTROPHYSIOLOGY STUDY AND ABLATION    . CARDIOVERSION    . CATARACT EXTRACTION W/PHACO Right 03/13/2016   Procedure: CATARACT EXTRACTION PHACO AND INTRAOCULAR LENS PLACEMENT (IOC);  Surgeon: Lockie Mola, MD;  Location: [facility];  Service: Ophthalmology;  Laterality: Right;  sleep apnea - no CPAP  . CATARACT EXTRACTION W/PHACO Left 04/10/2016   Procedure: CATARACT EXTRACTION PHACO AND INTRAOCULAR LENS PLACEMENT (IOC) left eye;  Surgeon: Lockie Mola, MD;  Location: [facility];  Service: Ophthalmology;  Laterality: Left;  . COLONOSCOPY    . TUMOR EXCISION     hip bone     There were no vitals filed for this visit. Neuro Re-ed     Subjective Assessment - 03/27/17 1032    Subjective Pt. has been feeling good but woke up with a headache today.    Pertinent History R sided Hip Surgery surgery in 1974 with lead to decreased activation of R hip musculature, Heart valve replacement   Limitations Lifting   Patient Stated Goals To improve balance   Currently in Pain? Yes   Pain Score 2    Pain Location Head     VORx1 in standing 60sx3, dizziness increased by 2. Ambulating in hall turning head sideways to read cards Head circles eyes closed-increased 2-3, 2x30 sec Airex eyes closed 2x60 seconds, vertical and head turns x 60 Tandem stance airex head turns vertical and horizontal 4x60 seconds Tandem walk the line Squat pick up ball, horizontal and vertical head turns at top x 7 balls x 4 trials Squat pick up ball, vertical head nods walking to next ball to squat and pick up x 4 trials Squatting/picking up 15lb weight from chair x 5, slight increase in dizzyness Squatting and picking up 15lb from floor with verbal cues for breathing and correct body mechanics x5  Pt. response to medical necessity:  Pt. will continue to benefit from skilled physical therapy to improve balance, reduce risk of falls, and return to previous  level of function         PT Long Term Goals - 03/05/17 1027      PT LONG TERM GOAL #1   Title Patient will be independent with HEP to demonstrate improvement after discharge from physical therapy   Baseline HEP continues to progress,    Time 6   Period Weeks   Status On-going     PT LONG TERM GOAL #2   Title Patient will score a 24/24 on the DGI to demonstrate significant improvement in gait stability when ambulating   Baseline 23/24 DGI; 02/12/17: 24/24   Time 6   Period Weeks   Status Achieved     PT LONG TERM GOAL #3   Title Patient will be able to walk in tandem for 76ft to demonstrate significant improvement in dynamic  balance and improved standing balance.   Baseline Patient steps out for support 7 times during test; 02/12/17: >20' without touching   Time 6   Period Weeks   Status Achieved     PT LONG TERM GOAL #4   Title Patient will be able to perform SLS for >10 sec to demonstrate significant improvement in static balance and ability to wash his LE's in the shower.    Baseline 8 secs B SLS; 02/12/17: 14.4s L/>30s R;    Time 6   Period Weeks   Status Achieved     PT LONG TERM GOAL #5   Title Patient will score <20 on the DHI to demonstrate significant improvement in dizziness   Baseline DHI: 54; 02/12/17: 24/100, 6/13; 36/100   Time 6   Period Weeks   Status On-going     Additional Long Term Goals   Additional Long Term Goals Yes     PT LONG TERM GOAL #6   Title Patient will score 56/56 on BERG balance evaluation to improve ability to interact with natural environment in a safe manner    Baseline 6/13: 53/56   Time 6   Period Weeks   Status New               Plan - 03/27/17 1235    Clinical Impression Statement Patient had slight increase in dizziness due to headache. Proper lifting body mechanics to decrease dizziness when performing functional activities. Pt. will continue to benefit from skilled physical therapy to improve balance, reduce risk of falls, and return to previous level of function   Rehab Potential Fair   Clinical Impairments Affecting Rehab Potential (+) highly motivated, faimly support (-) Symptoms evolving, >3 coormorbitites   PT Frequency 1x / week   PT Duration 6 weeks   PT Treatment/Interventions ADLs/Self Care Home Management;Gait training;Stair training;Functional mobility training;Therapeutic activities;Balance training;Therapeutic exercise;Patient/family education;Neuromuscular re-education;Canalith Repostioning;Energy conservation   PT Next Visit Plan head turns, circles   PT Home Exercise Plan VOR x 1 in standing with feet apart, plain background 60s x 3,  4x/day, Semitandem balance with horizontal head turns 30s/each LE x 3 each, 3 times/day;   Consulted and Agree with Plan of Care Patient      Patient will benefit from skilled therapeutic intervention in order to improve the following deficits and impairments:  Abnormal gait, Decreased balance, Decreased activity tolerance, Decreased endurance, Decreased strength, Decreased coordination, Difficulty walking, Decreased mobility, Dizziness, Increased fascial restricitons  Visit Diagnosis: Unsteadiness on feet  Unspecified lack of coordination  Muscle weakness (generalized)     Problem List Patient Active Problem List   Diagnosis Date Noted  . Barrett esophagus  11/28/2016  . Celiac disease 11/28/2016  . GERD (gastroesophageal reflux disease) 11/28/2016  . Sleep apnea 11/28/2016  . History of aortic valve replacement 09/25/2016  . Palpitations 09/09/2016  . Encounter for management of temporary pacemaker 08/29/2016  . Metabolic acidosis 08/29/2016  . S/P aortic valve replacement with bioprosthetic valve 08/29/2016  . Leg length discrepancy 04/06/2015  . Tendinopathy of left gluteus medius 02/25/2015  . Left hip pain 12/22/2014  . Spondylosis of lumbar region without myelopathy or radiculopathy 12/22/2014  . COPD with emphysema (HCC) 12/09/2014  . Stenosis of lumbosacral spine 11/10/2014  . 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 AS (aortic stenosis) 09/22/2013  . Bursitis of left hip 02/02/2013  . Atherosclerosis of native artery of extremity with intermittent claudication (HCC) 12/28/2012  . Facet arthritis of lumbar region (HCC) 12/01/2012  . DDD (degenerative disc disease), lumbar 11/23/2012  . Low back pain 11/23/2012  . Dyslipidemia 04/09/2012  . Hypertension 04/09/2012  . Paroxysmal atrial fibrillation (HCC) 04/09/2012  . Pulmonary nodule 12/05/2011   Precious BardMarina Nechemia Chiappetta, PT, DPT   Precious BardMarina Annastyn Silvey 03/27/2017, 12:38 PM  [facility] MAIN [facility] [address] [place], [place], [address] Phone: [phone]   Fax:  [phone]  Name: Jeremy Mason MRN: [id number] Date of Birth: 1941-07-28

## 2017-04-04 ENCOUNTER — Ambulatory Visit: Payer: Medicare Other

## 2017-04-10 ENCOUNTER — Ambulatory Visit: Payer: Medicare Other

## 2017-04-17 ENCOUNTER — Ambulatory Visit: Payer: Medicare Other

## 2017-04-17 DIAGNOSIS — R279 Unspecified lack of coordination: Secondary | ICD-10-CM

## 2017-04-17 DIAGNOSIS — R2681 Unsteadiness on feet: Secondary | ICD-10-CM | POA: Diagnosis not present

## 2017-04-17 DIAGNOSIS — M6281 Muscle weakness (generalized): Secondary | ICD-10-CM

## 2017-04-17 NOTE — Therapy (Signed)
[place] [facility] MAIN [facility] SERVICES [address] [place], [place], [address] Phone: [phone]   Fax:  [phone]  Physical Therapy Treatment  Patient Details  Name: Jeremy Mason MRN: [id number] Date of Birth: Jan 29, 1941 Referring Provider: Cristopher Peru MD  Encounter Date: 04/17/2017      PT End of Session - 04/17/17 1027    Visit Number 16   Number of Visits 34   Date for PT Re-Evaluation 05/14/17   Authorization Type 4/10 (will be 1/10 next session)   PT Start Time 0930   PT Stop Time 1015   PT Time Calculation (min) 45 min   Equipment Utilized During Treatment Gait belt   Activity Tolerance Patient tolerated treatment well;Treatment limited secondary to medical complications (Comment)   Behavior During Therapy [facility] for tasks assessed/performed      Past Medical History:  Diagnosis Date  . Aortic aneurysm (HCC)   . Aortic stenosis   . Arthritis    hips, legs  . Atrial fibrillation (HCC)   . Barrett's esophagus   . Celiac disease   . COPD (chronic obstructive pulmonary disease) (HCC)   . GERD (gastroesophageal reflux disease)   . Heart murmur   . History of hiatal hernia   . Hypertension   . PONV (postoperative nausea and vomiting)    nausea after cataract  . Sleep apnea    no CPAP  . Vertigo    no episodes - 2-3 yrs    Past Surgical History:  Procedure Laterality Date  . CARDIAC CATHETERIZATION    . CARDIAC ELECTROPHYSIOLOGY STUDY AND ABLATION    . CARDIOVERSION    . CATARACT EXTRACTION W/PHACO Right 03/13/2016   Procedure: CATARACT EXTRACTION PHACO AND INTRAOCULAR LENS PLACEMENT (IOC);  Surgeon: Lockie Mola, MD;  Location: [facility];  Service: Ophthalmology;  Laterality: Right;  sleep apnea - no CPAP  . CATARACT EXTRACTION W/PHACO Left 04/10/2016   Procedure: CATARACT EXTRACTION PHACO AND INTRAOCULAR LENS PLACEMENT (IOC) left eye;  Surgeon: Lockie Mola, MD;  Location: [facility];   Service: Ophthalmology;  Laterality: Left;  . COLONOSCOPY    . TUMOR EXCISION     hip bone    There were no vitals filed for this visit.      Subjective Assessment - 04/17/17 0941    Subjective Patient has been having "rollercoaster" of symptoms in the past few weeks. Getting sick to stomach, even throwing up sometimes because dizziness is so bad. Went to doctors and scheuled an appt with a specialist in September.    Pertinent History R sided Hip Surgery surgery in 1974 with lead to decreased activation of R hip musculature, Heart valve replacement   Limitations Lifting   Patient Stated Goals To improve balance   Currently in Pain? No/denies     DHI= 40, mod perception BERG=54  Neuro Re-ed  VORx1 in standing 60sx3, dizziness increased by 2. Airex eyes closed 2x60 seconds, vertical and head turns x 60 Ambulating in hall turning head sideways to read cards Head circles eyes closed-increased 2-3, 2x30 sec Tandem stance airex head turns vertical and horizontal 4x60 seconds Tandem walk the line Squat pick up ball, horizontal and vertical head turns at top x 7 balls x 4 trials Squat pick up ball, vertical head nods walking to next ball to squat and pick up x 4 trials     Pt. response to medical necessity:  Pt. will continue to benefit from skilled physical therapy to improve balance, reduce  risk of falls, and return to previous level of function      [facility] Assessment - 04/17/17 0001      Berg Balance Test   Sit to Stand Able to stand without using hands and stabilize independently   Standing Unsupported Able to stand safely 2 minutes   Sitting with Back Unsupported but Feet Supported on Floor or Stool Able to sit safely and securely 2 minutes   Stand to Sit Sits safely with minimal use of hands   Transfers Able to transfer safely, minor use of hands   Standing Unsupported with Eyes Closed Able to stand 10 seconds with supervision   Standing Ubsupported with Feet Together  Able to place feet together independently and stand 1 minute safely   From Standing, Reach Forward with Outstretched Arm Can reach confidently >25 cm (10")   From Standing Position, Pick up Object from Floor Able to pick up shoe safely and easily   From Standing Position, Turn to Look Behind Over each Shoulder Looks behind from both sides and weight shifts well   Turn 360 Degrees Able to turn 360 degrees safely in 4 seconds or less   Standing Unsupported, Alternately Place Feet on Step/Stool Able to stand independently and complete 8 steps >20 seconds   Standing Unsupported, One Foot in Front Able to place foot tandem independently and hold 30 seconds   Standing on One Leg Able to lift leg independently and hold > 10 seconds   Total Score 54             PT Long Term Goals - 04/17/17 1357      PT LONG TERM GOAL #1   Title Patient will be independent with HEP to demonstrate improvement after discharge from physical therapy   Baseline HEP continues to progress,    Time 6   Period Weeks   Status On-going     PT LONG TERM GOAL #2   Title Patient will score a 24/24 on the DGI to demonstrate significant improvement in gait stability when ambulating   Baseline 23/24 DGI; 02/12/17: 24/24    Time 6   Period Weeks   Status Achieved     PT LONG TERM GOAL #3   Title Patient will be able to walk in tandem for 5120ft to demonstrate significant improvement in dynamic balance and improved standing balance.   Baseline Patient steps out for support 7 times during test; 02/12/17: >20' without touching   Time 6   Period Weeks   Status Achieved     PT LONG TERM GOAL #4   Title Patient will be able to perform SLS for >10 sec to demonstrate significant improvement in static balance and ability to wash his LE's in the shower.    Baseline 8 secs B SLS; 02/12/17: 14.4s L/>30s R;    Time 6   Period Weeks   Status Achieved     PT LONG TERM GOAL #5   Title Patient will score <20 on the DHI to  demonstrate significant improvement in dizziness   Baseline DHI: 54; 02/12/17: 24/100, 6/13; 36/100 7/26: 40/100   Time 6   Period Weeks   Status On-going     PT LONG TERM GOAL #6   Title Patient will score 56/56 on BERG balance evaluation to improve ability to interact with natural environment in a safe manner    Baseline 6/13: 53/56 7/26: 54/56   Time 6   Period Weeks   Status On-going  Plan - 04/17/17 1356    Clinical Impression Statement Patient's balance continues to improve with dynamic and static challenges. Pt.'s symptoms of increased dizziness combine with headache in posterior portion of head. Slight increase of dizziness is noted with circular rotations of head and quick movements. DHI=40%, BERG=54/60.  Pt. will continue to benefit from skilled physical therapy to improve balance, reduce risk of falls, and return to previous level of function   Rehab Potential Fair   Clinical Impairments Affecting Rehab Potential (+) highly motivated, faimly support (-) Symptoms evolving, >3 coormorbitites   PT Frequency 1x / week   PT Duration 6 weeks   PT Treatment/Interventions ADLs/Self Care Home Management;Gait training;Stair training;Functional mobility training;Therapeutic activities;Balance training;Therapeutic exercise;Patient/family education;Neuromuscular re-education;Canalith Repostioning;Energy conservation   PT Next Visit Plan see vestib.    PT Home Exercise Plan VOR x 1 in standing with feet apart, plain background 60s x 3, 4x/day, Semitandem balance with horizontal head turns 30s/each LE x 3 each, 3 times/day;   Consulted and Agree with Plan of Care Patient      Patient will benefit from skilled therapeutic intervention in order to improve the following deficits and impairments:  Abnormal gait, Decreased balance, Decreased activity tolerance, Decreased endurance, Decreased strength, Decreased coordination, Difficulty walking, Decreased mobility, Dizziness,  Increased fascial restricitons  Visit Diagnosis: Unsteadiness on feet  Unspecified lack of coordination  Muscle weakness (generalized)       G-Codes - 04/17/17 1358    Functional Assessment Tool Used (Outpatient Only) BERG, DHI, clinical judgement, tandem stance   Functional Limitation Mobility: Walking and moving around   Mobility: Walking and Moving Around Current Status [phone]([id number]) At least 1 percent but less than 20 percent impaired, limited or restricted   Mobility: Walking and Moving Around Goal Status [phone](G8979) At least 1 percent but less than 20 percent impaired, limited or restricted      Problem List Patient Active Problem List   Diagnosis Date Noted  . Barrett esophagus 11/28/2016  . Celiac disease 11/28/2016  . GERD (gastroesophageal reflux disease) 11/28/2016  . Sleep apnea 11/28/2016  . History of aortic valve replacement 09/25/2016  . Palpitations 09/09/2016  . Encounter for management of temporary pacemaker 08/29/2016  . Metabolic acidosis 08/29/2016  . S/P aortic valve replacement with bioprosthetic valve 08/29/2016  . Leg length discrepancy 04/06/2015  . Tendinopathy of left gluteus medius 02/25/2015  . Left hip pain 12/22/2014  . Spondylosis of lumbar region without myelopathy or radiculopathy 12/22/2014  . COPD with emphysema (HCC) 12/09/2014  . Stenosis of lumbosacral spine 11/10/2014  . 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 AS (aortic stenosis) 09/22/2013  . Bursitis of left hip 02/02/2013  . Atherosclerosis of native artery of extremity with intermittent claudication (HCC) 12/28/2012  . Facet arthritis of lumbar region (HCC) 12/01/2012  . DDD (degenerative disc disease), lumbar 11/23/2012  . Low back pain 11/23/2012  . Dyslipidemia 04/09/2012  . Hypertension 04/09/2012  . Paroxysmal atrial fibrillation (HCC) 04/09/2012  . Pulmonary nodule 12/05/2011   Precious BardMarina Daney Moor, PT, DPT   Precious BardMarina Murrel Freet 04/17/2017,  1:59 PM  [facility] [address] [place], [place], [address] Phone: [phone]   Fax:  [phone]  Name: Jeremy Mason MRN: [id number] Date of Birth: 1940-12-06

## 2017-04-22 ENCOUNTER — Ambulatory Visit: Payer: Medicare Other

## 2017-04-29 ENCOUNTER — Encounter: Payer: Self-pay | Admitting: Physical Therapy

## 2017-04-29 ENCOUNTER — Ambulatory Visit: Payer: Medicare Other | Attending: Neurology | Admitting: Physical Therapy

## 2017-04-29 DIAGNOSIS — R2681 Unsteadiness on feet: Secondary | ICD-10-CM | POA: Insufficient documentation

## 2017-04-29 DIAGNOSIS — R279 Unspecified lack of coordination: Secondary | ICD-10-CM | POA: Diagnosis present

## 2017-04-29 NOTE — Therapy (Addendum)
[facility] MAIN [facility] SERVICES [address] [place], [place], [address] Phone: [phone]   Fax:  [phone]  Physical Therapy Treatment  Patient Details  Name: Jeremy Mason MRN: [id number] Date of Birth: 1941-05-18 Referring Provider: Cristopher Peru MD  Encounter Date: 04/29/2017      PT End of Session - 04/29/17 0852    Visit Number 17   Number of Visits 34   Date for PT Re-Evaluation 05/14/17   Authorization Type 1/10   PT Start Time 0849   PT Stop Time 1005   PT Time Calculation (min) 76 min   Equipment Utilized During Treatment Gait belt   Activity Tolerance Patient tolerated treatment well;Treatment limited secondary to medical complications (Comment)   Behavior During Therapy [facility] for tasks assessed/performed      Past Medical History:  Diagnosis Date  . Aortic aneurysm (HCC)   . Aortic stenosis   . Arthritis    hips, legs  . Atrial fibrillation (HCC)   . Barrett's esophagus   . Celiac disease   . COPD (chronic obstructive pulmonary disease) (HCC)   . GERD (gastroesophageal reflux disease)   . Heart murmur   . History of hiatal hernia   . Hypertension   . PONV (postoperative nausea and vomiting)    nausea after cataract  . Sleep apnea    no CPAP  . Vertigo    no episodes - 2-3 yrs    Past Surgical History:  Procedure Laterality Date  . CARDIAC CATHETERIZATION    . CARDIAC ELECTROPHYSIOLOGY STUDY AND ABLATION    . CARDIOVERSION    . CATARACT EXTRACTION W/PHACO Right 03/13/2016   Procedure: CATARACT EXTRACTION PHACO AND INTRAOCULAR LENS PLACEMENT (IOC);  Surgeon: Lockie Mola, MD;  Location: [facility];  Service: Ophthalmology;  Laterality: Right;  sleep apnea - no CPAP  . CATARACT EXTRACTION W/PHACO Left 04/10/2016   Procedure: CATARACT EXTRACTION PHACO AND INTRAOCULAR LENS PLACEMENT (IOC) left eye;  Surgeon: Lockie Mola, MD;  Location: [facility];  Service: Ophthalmology;   Laterality: Left;  . COLONOSCOPY    . TUMOR EXCISION     hip bone    There were no vitals filed for this visit.      Subjective Assessment - 04/29/17 0851    Subjective Patient reports that his symptoms have been getting better overall with therapy but states that he continues to have fluctuating days. Patient states that he is going to see a neurologist in September and that he might want to stop therapy services until after he has this appointment.    Pertinent History R sided Hip Surgery surgery in 1974 with lead to decreased activation of R hip musculature, Heart valve replacement   Limitations Lifting   Patient Stated Goals To improve balance   Currently in Pain? Yes   Pain Score 1    Pain Location Head   Pain Type Acute pain  for 3-4 days       VESTIBULAR AND BALANCE EVALUATION  HISTORY:  Subjective history of current problem: Patient reports that he had a spell of dizziness and began to have dizziness prior to surgery in December 2017. When he gets vertigo he usually gets nausea. Patient reports that he has not had any vertigo since 08/29/2016. Patient reports that he had an aortic valve replacement surgery on 08/29/2017. Patient states after the surgery he continued to have dizziness issues. Patient states that several years ago he had vertigo and reports he was seen  by Dr. Elenore Rota, ENT physician. Patient reports that he had some tests performed and he was told that he did not have BPPV. 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 Patient reports that he believes his memory has been affected since the stroke. Patient reports that he feels he has balance issues and that he gets off balance but catches himself if he gets off balance. Patient reports that he started having bad headaches this year and states he did not have a prior hsisory of headaches. Patient reports he started having 10/10  headaches which he felt were related to his balance issues. Patient reports that he takes over the counter medicine for his headaches but states it is not very effective in relieving his headaches. Patient reports in July 2018 he woke up at 5 am and was unable to get out of the bed due to dizziness without nausea or spinning. In the past when he had vertigo, he experienced nausea, would rest and "wake up and be able to maneuver again". Patient reports that he has fluctuating symptoms with good and bad days and expresses frustration in regards to the fluctuating nature of his symptoms. Patient reports that he has been seen by a neurologist and is seeing another neurologist at [facility] at the end of September. Patient reports that initially when performing the VOR exercise it brought on his symptoms and now it has gotten easier. Patient reports that currently he is helping to take his wife to medical treatment sessions 5 times a week for 6 weeks.   Description of dizziness: vertigo, unsteadiness, general unsteadiness, aural fullness bilaterally; moving his head in a circle can bring on his symptoms. Frequency: initially it was almost everyday when he started PT therapy. Patient reports that 4 days stretch has been his best with very light symptoms and prior to that it was every now and then he would have 1 light day. Duration: dizziness lasts hours to all day or days;   Nausea is rare now; headaches last for days and regularly takes tylenol for headaches Symptom nature: variable, intermittent  Provocative Factors: reports he can be sitting still and it will come on; reports headaches can come first and sometimes the dizziness comes first.  Easing Factors: sitting still for 5-10 minutes  Progression of symptoms: better  Falls (yes/no): no Number of falls in past 6 months: no  Auditory complaints (tinnitus, pain, drainage): once reports slight ringing and discomfort in both ears at times Vision (last eye  exam, diplopia, recent changes): denies in the past six months   Current Symptoms:  Review of systems negative for red flags.   EXAMINATION       COORDINATION: Finger to Nose:  Dysmetric   Past Pointing:   Left,  Moderate pass pointing   MUSCULOSKELETAL SCREEN: Cervical Spine ROM: AROM cervical spine right and left rotation limited by roughly 10 degrees WFL and flexion and extension WNL  Gait: Patient arrives to clinic ambulating without AD. Patient ambulates with fair cadence with reciprocating arm swing. Scanning of visual environment with gait is: fair  OCULOMOTOR / VESTIBULAR TESTING:  Oculomotor Exam- Room Light  Normal Abnormal Comments  Ocular Alignment N    Ocular ROM N    Spontaneous Nystagmus N    End-Gaze Nystagmus N  Present at end range; could be age appropriate  Smooth Pursuit N    Saccades  Abn Hypometric  saccades noted in all fields and slow saccades in inferior field  VOR  Abn Dizziness rated 2/10  VOR Cancellation  Abn Mild dizziness/balance/nausea  Left Head Thrust   Correction saccades both directions noted, but patient had difficult time keeping eyes open and with relaxing his head for proper testing; denies dizziness and nausea with testing  Right Head Thrust   See above  Head Shaking Nystagmus   Dizziness but no nystagmus noted    BPPV TESTS:  Symptoms Duration Intensity Nystagmus  L Dix-Hallpike none N/A N/A None observed  R Dix-Hallpike none N/A N/A None observed  L Head Roll none N/A N/A None observed  R Head Roll none N/A N/A None observed    VBI screen: negative  Neuromuscular Re-education:  VOR x 1 exercise: Patient performed VOR X 1 horizontal in standing with conflicting background 2 reps of 1 minute on firm surface with verbal cues initially for amount of head turn excursion as patient was turning head too far. Patient reports mild dizziness symptoms with this exercise rated as 2/10. Issued progression of conflicting background for  HEP.   Body Wall Rolls:  Patient performed 4 reps of supported, body wall rolls with eyes open. Patient reports 4/10 dizziness with this activity and noted patient with mild imbalance at times while performing this activity. Issued for HEP.   Newman Pies toss to self:  Patient performed static standing while tossing ball to self horizontal while tracking ball with head and eyes. Patient required verbal cuing to track with both head and eyes initially. Issued for HEP.  Newman Pies toss over shoulder: Patient performed 38' trials of forward and retro ambulation while tossing ball over one shoulder with return catch over opposite shoulder with CGA. Patient reports mild increase in dizziness with this activity and noted veering with retro ambulation.        PT Education - 04/29/17 [phone]    Education provided Yes   Education Details discussed plan of care and provided vestibular exercises for HEP   Person(s) Educated Patient   Methods Explanation;Demonstration;Handout;Verbal cues   Comprehension Verbalized understanding;Returned demonstration             PT Long Term Goals - 04/17/17 1357      PT LONG TERM GOAL #1   Title Patient will be independent with HEP to demonstrate improvement after discharge from physical therapy   Baseline HEP continues to progress,    Time 6   Period Weeks   Status On-going     PT LONG TERM GOAL #2   Title Patient will score a 24/24 on the DGI to demonstrate significant improvement in gait stability when ambulating   Baseline 23/24 DGI; 02/12/17: 24/24    Time 6   Period Weeks   Status Achieved     PT LONG TERM GOAL #3   Title Patient will be able to walk in tandem for 46ft to demonstrate significant improvement in dynamic balance and improved standing balance.   Baseline Patient steps out for support 7 times during test; 02/12/17: >20' without touching   Time 6   Period Weeks   Status Achieved     PT LONG TERM GOAL #4   Title Patient will be able to perform  SLS for >10 sec to demonstrate significant improvement in static balance and ability to wash his LE's in the shower.    Baseline 8 secs B SLS; 02/12/17: 14.4s L/>30s R;    Time 6   Period Weeks   Status Achieved  PT LONG TERM GOAL #5   Title Patient will score <20 on the DHI to demonstrate significant improvement in dizziness   Baseline DHI: 54; 02/12/17: 24/100, 6/13; 36/100 7/26: 40/100   Time 6   Period Weeks   Status On-going     PT LONG TERM GOAL #6   Title Patient will score 56/56 on BERG balance evaluation to improve ability to interact with natural environment in a safe manner    Baseline 6/13: 53/56 7/26: 54/56   Time 6   Period Weeks   Status On-going               Plan - 04/29/17 [id number]    Clinical Impression Statement Patient reports that he feels like he has made improvements in his symptoms since starting therapy, but is concerned about using all of his medicare therapy days. Patient states he goes to see a neurologist in September and is wondering if he should stop therapy until after he sees the doctor. Discussed with patient and he decided to return for visit next week to establish a vestibular HEP and then to stop therapy until he sees the neurologist in September. Patient issued body wall rolls and ball toss to self and ball circles for HEP and added progression of conflicting background for VOR X 1 exercise. Performed vestibular oculomotor testing this date and patient with abnormal saccades, VOR cancellation and VOR whcih could be indicative of central findings. Able to recreate patient's symptoms of dizziness and imbalance in clinic this date and patient would benefit from continued PT services to further address symptoms and goals as set on plan of care.     Rehab Potential Fair   Clinical Impairments Affecting Rehab Potential (+) highly motivated, faimly support (-) Symptoms evolving, >3 coormorbitites   PT Frequency 1x / week   PT Duration 6 weeks   PT  Treatment/Interventions ADLs/Self Care Home Management;Gait training;Stair training;Functional mobility training;Therapeutic activities;Balance training;Therapeutic exercise;Patient/family education;Neuromuscular re-education;Canalith Repostioning;Energy conservation   PT Next Visit Plan vestibular HEP; consider VOR X 2   PT Home Exercise Plan VOR x 1 in standing with feet apart, conflicting background 60s x 3, 3x/day, Semitandem balance with horizontal head turns 30s/each LE x 3 each, 3 times/day; ball toss to self horizontal and vertical 1 minute reps and body wall rolls 4 reps supported with EO.   Consulted and Agree with Plan of Care Patient      Patient will benefit from skilled therapeutic intervention in order to improve the following deficits and impairments:  Abnormal gait, Decreased balance, Decreased activity tolerance, Decreased endurance, Decreased strength, Decreased coordination, Difficulty walking, Decreased mobility, Dizziness, Increased fascial restricitons  Visit Diagnosis: Unsteadiness on feet  Unspecified lack of coordination     Problem List Patient Active Problem List   Diagnosis Date Noted  . Barrett esophagus 11/28/2016  . Celiac disease 11/28/2016  . GERD (gastroesophageal reflux disease) 11/28/2016  . Sleep apnea 11/28/2016  . History of aortic valve replacement 09/25/2016  . Palpitations 09/09/2016  . Encounter for management of temporary pacemaker 08/29/2016  . Metabolic acidosis 08/29/2016  . S/P aortic valve replacement with bioprosthetic valve 08/29/2016  . Leg length discrepancy 04/06/2015  . Tendinopathy of left gluteus medius 02/25/2015  . Left hip pain 12/22/2014  . Spondylosis of lumbar region without myelopathy or radiculopathy 12/22/2014  . COPD with emphysema (HCC) 12/09/2014  . Stenosis of lumbosacral spine 11/10/2014  . 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 AS (aortic stenosis)  09/22/2013  .  Bursitis of left hip 02/02/2013  . Atherosclerosis of native artery of extremity with intermittent claudication (HCC) 12/28/2012  . Facet arthritis of lumbar region (HCC) 12/01/2012  . DDD (degenerative disc disease), lumbar 11/23/2012  . Low back pain 11/23/2012  . Dyslipidemia 04/09/2012  . Hypertension 04/09/2012  . Paroxysmal atrial fibrillation (HCC) 04/09/2012  . Pulmonary nodule 12/05/2011    Treg Diemer 04/29/2017, 2:17 PM  [facility] MAIN [facility] SERVICES [address] [place], [place], [address] Phone: [phone]   Fax:  [phone]  Name: TRELLIS VANOVERBEKE MRN: [id number] Date of Birth: 09-23-1941

## 2017-05-01 ENCOUNTER — Ambulatory Visit: Payer: Medicare Other

## 2017-05-06 ENCOUNTER — Encounter: Payer: Self-pay | Admitting: Physical Therapy

## 2017-05-06 ENCOUNTER — Ambulatory Visit: Payer: Medicare Other | Admitting: Physical Therapy

## 2017-05-06 DIAGNOSIS — R2681 Unsteadiness on feet: Secondary | ICD-10-CM | POA: Diagnosis not present

## 2017-05-06 DIAGNOSIS — R279 Unspecified lack of coordination: Secondary | ICD-10-CM

## 2017-05-06 NOTE — Therapy (Signed)
[facility] MAIN [facility] SERVICES [address] [place], [place], [address] Phone: [phone]   Fax:  [phone]  Physical Therapy Treatment  Patient Details  Name: Jeremy Mason MRN: [id number] Date of Birth: 03-May-1941 Referring Provider: Cristopher Peru MD  Encounter Date: 05/06/2017      PT End of Session - 05/06/17 0907    Visit Number 18   Number of Visits 34   Date for PT Re-Evaluation 05/14/17   Authorization Type 2/10   PT Start Time 0900   PT Stop Time 0950   PT Time Calculation (min) 50 min   Equipment Utilized During Treatment --   Activity Tolerance Patient tolerated treatment well   Behavior During Therapy [facility] for tasks assessed/performed      Past Medical History:  Diagnosis Date  . Aortic aneurysm (HCC)   . Aortic stenosis   . Arthritis    hips, legs  . Atrial fibrillation (HCC)   . Barrett's esophagus   . Celiac disease   . COPD (chronic obstructive pulmonary disease) (HCC)   . GERD (gastroesophageal reflux disease)   . Heart murmur   . History of hiatal hernia   . Hypertension   . PONV (postoperative nausea and vomiting)    nausea after cataract  . Sleep apnea    no CPAP  . Vertigo    no episodes - 2-3 yrs    Past Surgical History:  Procedure Laterality Date  . CARDIAC CATHETERIZATION    . CARDIAC ELECTROPHYSIOLOGY STUDY AND ABLATION    . CARDIOVERSION    . CATARACT EXTRACTION W/PHACO Right 03/13/2016   Procedure: CATARACT EXTRACTION PHACO AND INTRAOCULAR LENS PLACEMENT (IOC);  Surgeon: Lockie Mola, MD;  Location: [facility];  Service: Ophthalmology;  Laterality: Right;  sleep apnea - no CPAP  . CATARACT EXTRACTION W/PHACO Left 04/10/2016   Procedure: CATARACT EXTRACTION PHACO AND INTRAOCULAR LENS PLACEMENT (IOC) left eye;  Surgeon: Lockie Mola, MD;  Location: [facility];  Service: Ophthalmology;  Laterality: Left;  . COLONOSCOPY    . TUMOR EXCISION     hip bone     There were no vitals filed for this visit.      Subjective Assessment - 05/06/17 0904    Subjective Patient reports that he took 4 650 mg Tylenol pills yesterday and states it did not help much. Patient reports the headache has been present for several days. Patient reports that he is more symptomatic today and that he does not think that he will do as well.    Pertinent History R sided Hip Surgery surgery in 1974 with lead to decreased activation of R hip musculature, Heart valve replacement   Limitations Lifting   Patient Stated Goals To improve balance   Currently in Pain? Yes   Pain Score 5    Pain Location Head   Pain Orientation Posterior   Pain Onset In the past 7 days  few days      Neuromuscular Re-education:  VOR X 1 exercise:   Patient performed VOR X 1 horizontal in standing with conflicting background 2 reps of 1 minute each with verbal cues for technique.  Patient reports that at home when he performs the VOR exercise that it increases his dizziness at times.   VOR x 2 exercise:  Demonstrated and educated as to VOR X 2. Patient performed VOR X 2 horiz in sitting 3 reps of 1 minute each with verbal cues for technique.  Patient reports 3/10 dizziness  and reports that this increases his headache.  Body Wall Rolls:  Patient attempted 2 reps of unsupported, body wall rolls with eyes open and 2 reps with eyes closed, but needed to touch wall for support multiple times and noted increase in sway upon stopping movement.  Patient reports dizziness with this activity.   Ball Sorting Activity: On firm surface, performed transferring multicolored balls from bin placed on floor to another bin placed 180 degrees on the other side of patient while patient visually tracks the ball so that patient is required to turn 180 degrees L and R to turn to bend over and pick and place balls in the bins with SBA.   Card Sorting Activity:  Patient performed deck of card sorting activity  on mat table sorting by numbers and then turning to place cards on low chair to incorporate horizontal and vertical head turning and body turns as these are symptom aggravating moves for patient. Patient reports no increase in dizziness with this activity.   Hallway ball toss:  In hallway, worked on ball toss against one wall with alternating quick turns to toss ball against opposite wall while tracking with eyes and head. No imbalance noted.   Dribbling:  Patient dribbled ball alternating between dribbling to the left and right side of his body while tracking ball with head and eyes to promote head turning and eye tracking 175' times 2. Patient demonstrates good speed with no veering during activity with S, but did note mild imbalance with turning. Patient reports increase in dizziness with this activity.   Spent time discussing with patient plan of care and updated home exercise program with handout provided. Patient would like to stop therapy services at this time until he has his neurology appointment in September. Patient reports that he will perform HEP and will contact this writer after his appointment to communicate if he would like to continue or stop PT services at that time.       PT Education - 05/06/17 1417    Education provided Yes   Education Details Discussed plan of care; provided updated vestibular HEP-including VOR X 1 horizontal with conflicting background in standing, VOR X 2 in sitting horizontal, semi-tandem stance with body turns and horizontal head turns, supported body wall rolls EO/EC, ball circles with head/eye follow, ball toss to self horizontal and vertical with head/eye follow.   Person(s) Educated Patient   Methods Explanation;Handout;Demonstration   Comprehension Verbalized understanding;Returned demonstration             PT Long Term Goals - 04/17/17 1357      PT LONG TERM GOAL #1   Title Patient will be independent with HEP to demonstrate improvement  after discharge from physical therapy   Baseline HEP continues to progress,    Time 6   Period Weeks   Status On-going     PT LONG TERM GOAL #2   Title Patient will score a 24/24 on the DGI to demonstrate significant improvement in gait stability when ambulating   Baseline 23/24 DGI; 02/12/17: 24/24    Time 6   Period Weeks   Status Achieved     PT LONG TERM GOAL #3   Title Patient will be able to walk in tandem for 48ft to demonstrate significant improvement in dynamic balance and improved standing balance.   Baseline Patient steps out for support 7 times during test; 02/12/17: >20' without touching   Time 6   Period Weeks   Status Achieved  PT LONG TERM GOAL #4   Title Patient will be able to perform SLS for >10 sec to demonstrate significant improvement in static balance and ability to wash his LE's in the shower.    Baseline 8 secs B SLS; 02/12/17: 14.4s L/>30s R;    Time 6   Period Weeks   Status Achieved     PT LONG TERM GOAL #5   Title Patient will score <20 on the DHI to demonstrate significant improvement in dizziness   Baseline DHI: 54; 02/12/17: 24/100, 6/13; 36/100 7/26: 40/100   Time 6   Period Weeks   Status On-going     PT LONG TERM GOAL #6   Title Patient will score 56/56 on BERG balance evaluation to improve ability to interact with natural environment in a safe manner    Baseline 6/13: 53/56 7/26: 54/56   Time 6   Period Weeks   Status On-going               Plan - 05/06/17 0907    Clinical Impression Statement Patient provided with vestibular home exercise program including progressions this date. Patient would like to hold therapy until he is seen by the neurologist in September. Patient states that he would like to resume PT services after he is seen by the neurologist. Patient has been issued and educated as to home exercise program and plan is for patient to perform HEP daily until his medical appointment in September. Patient to call if he  feels his symptoms of improved and if he feels he no longer requires PT.    Rehab Potential Fair   Clinical Impairments Affecting Rehab Potential (+) highly motivated, faimly support (-) Symptoms evolving, >3 coormorbitites   PT Frequency 1x / week   PT Duration 6 weeks   PT Treatment/Interventions ADLs/Self Care Home Management;Gait training;Stair training;Functional mobility training;Therapeutic activities;Balance training;Therapeutic exercise;Patient/family education;Neuromuscular re-education;Canalith Repostioning;Energy conservation   PT Next Visit Plan --   PT Home Exercise Plan VOR x 1 in standing with feet apart, conflicting background 60s x 3, 3x/day, Semitandem balance with horizontal head turns 30s/each LE x 3 each, 3 times/day; ball toss to self horizontal and vertical 1 minute reps and body wall rolls 4 reps supported with EO.   Consulted and Agree with Plan of Care Patient      Patient will benefit from skilled therapeutic intervention in order to improve the following deficits and impairments:  Abnormal gait, Decreased balance, Decreased activity tolerance, Decreased endurance, Decreased strength, Decreased coordination, Difficulty walking, Decreased mobility, Dizziness, Increased fascial restricitons  Visit Diagnosis: Unsteadiness on feet  Unspecified lack of coordination     Problem List Patient Active Problem List   Diagnosis Date Noted  . Barrett esophagus 11/28/2016  . Celiac disease 11/28/2016  . GERD (gastroesophageal reflux disease) 11/28/2016  . Sleep apnea 11/28/2016  . History of aortic valve replacement 09/25/2016  . Palpitations 09/09/2016  . Encounter for management of temporary pacemaker 08/29/2016  . Metabolic acidosis 08/29/2016  . S/P aortic valve replacement with bioprosthetic valve 08/29/2016  . Leg length discrepancy 04/06/2015  . Tendinopathy of left gluteus medius 02/25/2015  . Left hip pain 12/22/2014  . Spondylosis of lumbar region without  myelopathy or radiculopathy 12/22/2014  . COPD with emphysema (HCC) 12/09/2014  . Stenosis of lumbosacral spine 11/10/2014  . 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 AS (aortic stenosis) 09/22/2013  . Bursitis of left hip 02/02/2013  . Atherosclerosis of native artery of  extremity with intermittent claudication (HCC) 12/28/2012  . Facet arthritis of lumbar region (HCC) 12/01/2012  . DDD (degenerative disc disease), lumbar 11/23/2012  . Low back pain 11/23/2012  . Dyslipidemia 04/09/2012  . Hypertension 04/09/2012  . Paroxysmal atrial fibrillation (HCC) 04/09/2012  . Pulmonary nodule 12/05/2011   Mardelle Matte PT, DPT [phone] Mardelle Matte 05/06/2017, 2:23 PM  [facility] MAIN [facility] SERVICES [address] [place], [place], [address] Phone: [phone]   Fax:  [phone]  Name: Jeremy Mason MRN: [id number] Date of Birth: July 25, 1941

## 2017-07-11 ENCOUNTER — Ambulatory Visit: Payer: Medicare Other | Attending: Neurology | Admitting: Physical Therapy

## 2017-07-11 ENCOUNTER — Encounter: Payer: Self-pay | Admitting: Physical Therapy

## 2017-07-11 DIAGNOSIS — R42 Dizziness and giddiness: Secondary | ICD-10-CM | POA: Diagnosis present

## 2017-07-11 DIAGNOSIS — R2681 Unsteadiness on feet: Secondary | ICD-10-CM | POA: Diagnosis present

## 2017-07-11 NOTE — Therapy (Signed)
[place] MAIN [facility] SERVICES [address] [place], [place], [address] Phone: [phone]   Fax:  [phone]  Physical Therapy Re-evaluation  Patient Details  Name: Jeremy Mason MRN: [id number] Date of Birth: [birth year] Referring Provider: Jennings Books MD  Encounter Date: 07/11/2017      PT End of Session - 07/11/17 1039    Visit Number 19   Number of Visits 34   Date for PT Re-Evaluation 09/05/17   Authorization Type 1/10   PT Start Time 1040   PT Stop Time 1138   PT Time Calculation (min) 58 min   Equipment Utilized During Treatment Gait belt   Activity Tolerance Patient tolerated treatment well  pt did required rest break secondary to headache/dizziness symptoms during re-testing   Behavior During Therapy [facility] for tasks assessed/performed      Past Medical History:  Diagnosis Date  . Aortic aneurysm ([place])   . Aortic stenosis   . Arthritis    hips, legs  . Atrial fibrillation ([place])   . Barrett's esophagus   . Celiac disease   . COPD (chronic obstructive pulmonary disease) ([place])   . GERD (gastroesophageal reflux disease)   . Heart murmur   . History of hiatal hernia   . Hypertension   . PONV (postoperative nausea and vomiting)    nausea after cataract  . Sleep apnea    no CPAP  . Vertigo    no episodes - 2-3 yrs    Past Surgical History:  Procedure Laterality Date  . CARDIAC CATHETERIZATION    . CARDIAC ELECTROPHYSIOLOGY STUDY AND ABLATION    . CARDIOVERSION    . CATARACT EXTRACTION W/PHACO Right 03/13/2016   Procedure: CATARACT EXTRACTION PHACO AND INTRAOCULAR LENS PLACEMENT (IOC);  Surgeon: Leandrew Koyanagi, MD;  Location: [place];  Service: Ophthalmology;  Laterality: Right;  sleep apnea - no CPAP  . CATARACT EXTRACTION W/PHACO Left 04/10/2016   Procedure: CATARACT EXTRACTION PHACO AND INTRAOCULAR LENS PLACEMENT ([place]) left eye;  Surgeon: Leandrew Koyanagi, MD;  Location: [place];   Service: Ophthalmology;  Laterality: Left;  . COLONOSCOPY    . TUMOR EXCISION     hip bone    There were no vitals filed for this visit.      Subjective Assessment - 07/11/17 1038    Subjective Patient reports that he is having dizziness and a headache today. Patient states he still is experiencing "up and down like a yo-yo" symptoms of dizziness and headaches. Patient states that he "runs 2-3/10 dizziness about all of the time" up to an 8/10 at the worst.  Patient states he has been trying to trying to do some of his exercises but states he stops if he gets too dizzy and some days he does only a few.    Pertinent History R sided Hip Surgery surgery in 1974 with lead to decreased activation of R hip musculature, Heart valve replacement   Limitations Lifting   Patient Stated Goals To improve balance, reduce dizziness and headache symptoms   Currently in Pain? Yes   Pain Score 3    Pain Location Head   Pain Type Chronic pain   Pain Onset Today  few days   Pain Frequency Several days a week           [facility] PT Assessment - 07/11/17 1112      Dynamic Gait Index   Level Surface Normal   Change in Gait Speed Normal   Gait with  Horizontal Head Turns Mild Impairment   Gait with Vertical Head Turns Mild Impairment   Gait and Pivot Turn Normal   Step Over Obstacle Moderate Impairment   Step Around Obstacles Normal   Steps Mild Impairment   Total Score 19     Patient reports that he saw Dr. Normand Sloop, vestibular certified physical therapist. Patient reports that Dr. Normand Sloop recommended that patient continue with vestibular rehab.  Patient reports that Dr. Dixie Dials, neurologist did not feel patient's symptoms are related to his stroke. Patient reports that he goes back at the end of November for a follow-up appointment with Dr. Dixie Dials. Patient expresses that he is going to ask his neurologist to get an MRA of the brain and a spinal tap to evaluate CSF pressure. Patient was referred  to the headache clinic and he has an appointment in March 2019.  Patient reports he has been having headaches daily that are severe ranging from a 2 to a 10/10 at times.  Patient reports he saw his cardiologist/ electrophysiologist and was told he has heart murmurs. Patient reports that he goes this afternoon for an echogram and carotid ultrasound testing.   Patient reports he also had a follow-up appointment with Dr. Manuella Ghazi, neurologist. Patient reports that he is going to have "a procedure where they insert a tube in his nose to inject Lidocaine" and states he is getting this procedure done 3 times. Patient reports he is getting this procedure done in November.   Neuromuscular Re-education: Worked on SLS and tandem walking  Performed DGI, ABC, and DHI functional outcome measures.   FUNCTIONAL OUTCOME MEASURES:  Results Comments  DHI 50/100 Moderate perception of handicap; in need of intervention  ABC Scale 37.5% Falls risk; in need of intervention  DGI 20/24 Mild falls risk; in need of intervention         PT Education - 07/11/17 1039    Education provided Yes   Education Details reviewed goals and plan of care; reviewed HEP   Person(s) Educated Patient   Methods Explanation;Demonstration   Comprehension Verbalized understanding             PT Long Term Goals - 07/11/17 1039      PT LONG TERM GOAL #1   Title Patient will be independent with HEP indepednently for self-management   Baseline HEP continues to progress,    Time 4   Period Weeks   Status Partially Met     PT LONG TERM GOAL #2   Title Patient will demonstrate reduced falls risk as evidenced by Dynamic Gait Index (DGI) 21/24 or greater.   Baseline 23/24 DGI; 02/12/17: 24/24; scored 19/24 on 07/11/17   Time 8   Period Weeks   Status Achieved     PT LONG TERM GOAL #3   Title Patient will be able to walk in tandem for 61f to demonstrate significant improvement in dynamic balance and improved standing balance.    Baseline Patient steps out for support 7 times during test; 02/12/17: >20' without touching; demonstrated greater than 5 steps out for support on 07/11/17   Time 8   Period Weeks   Status Partially Met     PT LONG TERM GOAL #4   Title Patient will be able to perform SLS for >10 sec to demonstrate significant improvement in static balance and ability to wash his LE's in the shower.    Baseline 8 secs B SLS; 02/12/17: 14.4s L/>30s R; 21 seconds L and R SLS on 07/11/2017  Time 8   Period Weeks   Status --     PT LONG TERM GOAL #5   Title Patient will reduce perceived disability to low levels as indicated by <40 on Dizziness Handicap Inventory to demonstrate significant improvement in dizziness.   Baseline DHI: 54; 02/12/17: 24/100, 6/13; 36/100 7/26: 40/100; scored 50/100 on 14-Jul-2017   Time 8   Period Weeks   Status On-going     PT LONG TERM GOAL #6   Title --   Baseline --   Time --   Period --   Status --               Plan - 07/14/2017 1040    Clinical Impression Statement Patient returns to clinic specifically for vestibular therapy at this time. Patient reporting daily episodes of headaches and dizziness. Patient reports dizziness symptom intensity varies significantly. In addition, patient reporting that he continues to have difficulty with his balance and has to slow down his movements and use caution with mobility. Re-tested functional outcomes this date. Patient scored 19/24 on the DGI and scored 37.5% on the ABC scale indicating falls risk. Patient scored 50/100 on the [place] indicating moderate perception of handicap. Patient reporting inconsistent performance of HEP. Reinforced and reviewed HEP. Patient improved from 8 seconds to 21 seconds left and right SLS times. Patient would benefit from vestibular therapy to try to address subjective symptoms of dizziness and imbalance, to try to reduce falls risk and to address goals and functional deficits identified.     Rehab  Potential Fair   Clinical Impairments Affecting Rehab Potential (+) highly motivated, family support (-) Symptoms evolving, >3 coormorbitites   PT Frequency 1x / week   PT Duration 8 weeks   PT Treatment/Interventions Gait training;Stair training;Functional mobility training;Therapeutic activities;Balance training;Therapeutic exercise;Patient/family education;Neuromuscular re-education;Canalith Repostioning;Vestibular   PT Next Visit Plan Review HEP, consider VOR X2, active eye movement between 2 targets, card sorting activity   PT Home Exercise Plan VOR x 1 in standing with feet apart, conflicting background 16X x 3, 3x/day, Semitandem balance with horizontal head turns 30s/each LE x 3 each, 3 times/day; ball toss to self horizontal and vertical 1 minute reps and body wall rolls 4 reps supported with EO.   Consulted and Agree with Plan of Care Patient      Patient will benefit from skilled therapeutic intervention in order to improve the following deficits and impairments:  Decreased balance, Decreased activity tolerance, Decreased endurance, Decreased strength, Decreased coordination, Difficulty walking, Decreased mobility, Dizziness, Increased fascial restricitons  Visit Diagnosis: Dizziness and giddiness  Unsteadiness on feet       G-Codes - 07/14/17 1223    Functional Assessment Tool Used (Outpatient Only) DGI, DHI, clinical judgment, tandem stance, ABC scale, SLS   Functional Limitation Mobility: Walking and moving around   Mobility: Walking and Moving Around Current Status [phone]) At least 20 percent but less than 40 percent impaired, limited or restricted   Mobility: Walking and Moving Around Goal Status [phone]) At least 1 percent but less than 20 percent impaired, limited or restricted      Problem List Patient Active Problem List   Diagnosis Date Noted  . Barrett esophagus 11/28/2016  . Celiac disease 11/28/2016  . GERD (gastroesophageal reflux disease) 11/28/2016  .  Sleep apnea 11/28/2016  . History of aortic valve replacement 09/25/2016  . Palpitations 09/09/2016  . Encounter for management of temporary pacemaker 08/29/2016  . Metabolic acidosis 11/91/4782  . S/P aortic valve replacement  with bioprosthetic valve 08/29/2016  . Leg length discrepancy 04/06/2015  . Tendinopathy of left gluteus medius 02/25/2015  . Left hip pain 12/22/2014  . Spondylosis of lumbar region without myelopathy or radiculopathy 12/22/2014  . COPD with emphysema ([place]) 12/09/2014  . Stenosis of lumbosacral spine 11/10/2014  . 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 AS (aortic stenosis) 09/22/2013  . Bursitis of left hip 02/02/2013  . Atherosclerosis of native artery of extremity with intermittent claudication ([place]) 12/28/2012  . Facet arthritis of lumbar region ([place]) 12/01/2012  . DDD (degenerative disc disease), lumbar 11/23/2012  . Low back pain 11/23/2012  . Dyslipidemia 04/09/2012  . Hypertension 04/09/2012  . Paroxysmal atrial fibrillation ([place]) 04/09/2012  . Pulmonary nodule 12/05/2011   Lady Deutscher PT, DPT [phone] Lady Deutscher 07/11/2017, 12:59 PM  [place] MAIN [facility] SERVICES [address] [place], [place], [address] Phone: [phone]   Fax:  [phone]  Name: DEWIE AHART MRN: [id number] Date of Birth: 07/23/1941

## 2017-07-14 ENCOUNTER — Encounter: Payer: Self-pay | Admitting: Physical Therapy

## 2017-07-14 ENCOUNTER — Ambulatory Visit: Payer: Medicare Other | Admitting: Physical Therapy

## 2017-07-14 DIAGNOSIS — R42 Dizziness and giddiness: Secondary | ICD-10-CM | POA: Diagnosis not present

## 2017-07-14 DIAGNOSIS — R2681 Unsteadiness on feet: Secondary | ICD-10-CM

## 2017-07-14 NOTE — Therapy (Signed)
[place] [facility] MAIN [facility] SERVICES [address] [place], [place], [address] Phone: [phone]   Fax:  [phone]  Physical Therapy Treatment  Patient Details  Name: Jeremy Mason MRN: [id number] Date of Birth: Apr 06, 1941 Referring Provider: Cristopher Peru MD  Encounter Date: 07/14/2017    Past Medical History:  Diagnosis Date  . Aortic aneurysm (HCC)   . Aortic stenosis   . Arthritis    hips, legs  . Atrial fibrillation (HCC)   . Barrett's esophagus   . Celiac disease   . COPD (chronic obstructive pulmonary disease) (HCC)   . GERD (gastroesophageal reflux disease)   . Heart murmur   . History of hiatal hernia   . Hypertension   . PONV (postoperative nausea and vomiting)    nausea after cataract  . Sleep apnea    no CPAP  . Vertigo    no episodes - 2-3 yrs    Past Surgical History:  Procedure Laterality Date  . CARDIAC CATHETERIZATION    . CARDIAC ELECTROPHYSIOLOGY STUDY AND ABLATION    . CARDIOVERSION    . CATARACT EXTRACTION W/PHACO Right 03/13/2016   Procedure: CATARACT EXTRACTION PHACO AND INTRAOCULAR LENS PLACEMENT (IOC);  Surgeon: Lockie Mola, MD;  Location: [facility];  Service: Ophthalmology;  Laterality: Right;  sleep apnea - no CPAP  . CATARACT EXTRACTION W/PHACO Left 04/10/2016   Procedure: CATARACT EXTRACTION PHACO AND INTRAOCULAR LENS PLACEMENT (IOC) left eye;  Surgeon: Lockie Mola, MD;  Location: [facility];  Service: Ophthalmology;  Laterality: Left;  . COLONOSCOPY    . TUMOR EXCISION     hip bone    There were no vitals filed for this visit.      Subjective Assessment - 07/14/17 1318    Subjective Patient reports that he had a bad day yesterday with dizziness and headache. Patient reports today he has a headache but states his dizziness is better today. Patient states he has never tried a migraine diet before. Patient states he is getting that lidocaine procedure for headaches  the first week of November.    Pertinent History R sided Hip Surgery surgery in 1974 with lead to decreased activation of R hip musculature, Heart valve replacement   Limitations Lifting   Patient Stated Goals To improve balance, reduce dizziness and headache symptoms   Pain Score 6    Pain Location Head  headache   Pain Descriptors / Indicators Discomfort   Pain Type Chronic pain   Pain Onset Today  few days       Neuromuscular Re-education:  VOR x 1 exercise: Patient performed walking VOR X 1 horizontal with conflicting background multiple reps of walking 69' with CGA as patient with several small episodes of uneven steppage and change in gait speed as well as several small losses of balance where he had to take a step to correct.  Patient reports increase in dizziness symptoms and imbalance with this exercise.  Active eye movement between two targets: Discussed and demonstrated active eye movements between two targets exercise. Will plan to add this exercise for home exercise program next session.  Patient performed in sitting active eye movements between two targets horizontal 3 reps of 60 seconds each. Patient with verbal cuing initially for technique.  Patient reported increased dizziness rated 4/10 with performing exercise initially and 2/10 after last rep.   Airex balance beam: On Airex balance beam, performed sideways stance static holds with horizontal and vertical head turns.  On Airex balance  beam, performed sideways stepping with horizontal head turns 5' times 4 reps with CGA. On Airex balance beam, performed sideways stepping with vertical head turns 5' times 2 reps with CGA. Patient denies increase in dizziness with this activity but did not this was challenging to the balance.   Body Wall Rolls:  Patient performed 4 reps of supported, body wall rolls with eyes open.  Patient performed 1 rep of supported, body wall rolls with eyes closed. Patient reports increase in  imbalance and dizziness with this activity.       Education    Discussed HEP. Discussed migraine diet and provided handout from [email or web address] on vestibular migraine and migraine diet. Patient was the learner. Mode of education was verbal and handout. Patient in agreement and verbalized understanding.                    PT Long Term Goals - 07/11/17 1039      PT LONG TERM GOAL #1   Title Patient will be independent with HEP indepednently for self-management   Baseline HEP continues to progress,    Time 4   Period Weeks   Status Partially Met     PT LONG TERM GOAL #2   Title Patient will demonstrate reduced falls risk as evidenced by Dynamic Gait Index (DGI) 21/24 or greater.   Baseline 23/24 DGI; 02/12/17: 24/24; scored 19/24 on 07/11/17   Time 8   Period Weeks   Status Achieved     PT LONG TERM GOAL #3   Title Patient will be able to walk in tandem for 60f to demonstrate significant improvement in dynamic balance and improved standing balance.   Baseline Patient steps out for support 7 times during test; 02/12/17: >20' without touching; demonstrated greater than 5 steps out for support on 07/11/17   Time 8   Period Weeks   Status Partially Met     PT LONG TERM GOAL #4   Title Patient will be able to perform SLS for >10 sec to demonstrate significant improvement in static balance and ability to wash his LE's in the shower.    Baseline 8 secs B SLS; 02/12/17: 14.4s L/>30s R; 21 seconds L and R SLS on 07/11/2017   Time 8   Period Weeks   Status --     PT LONG TERM GOAL #5   Title Patient will reduce perceived disability to low levels as indicated by <40 on Dizziness Handicap Inventory to demonstrate significant improvement in dizziness.   Baseline DHI: 54; 02/12/17: 24/100, 6/13; 36/100 7/26: 40/100; scored 50/100 on 07/11/17   Time 8   Period Weeks   Status On-going     PT LONG TERM GOAL #6   Title --   Baseline --   Time --   Period --   Status --        Plan - 08/05/17 1525    Clinical Impression Statement  Patient able to progress to VOR X 1 with conflicting background while ambulating. Patient reported increase in dizziness symptoms with active eye movements between two targets exercise. Discussed migraine diet and educational materials provided for patient from [email or web address] Patient would benefit from continued PT services to      Rehab Potential  Fair    Clinical Impairments Affecting Rehab Potential  (+) highly motivated, family support (-) Symptoms evolving, >3 coormorbitites    PT Frequency  1x / week    PT Duration  8 weeks    PT Treatment/Interventions  Gait training;Stair training;Functional mobility training;Therapeutic activities;Balance training;Therapeutic exercise;Patient/family education;Neuromuscular re-education;Canalith Repostioning;Vestibular    PT Next Visit Plan  Review HEP, consider VOR X2, active eye movement between 2 targets, card sorting activity    PT Home Exercise Plan  VOR x 1 in standing with feet apart, conflicting background 07K x 3, 3x/day, Semitandem balance with horizontal head turns 30s/each LE x 3 each, 3 times/day; ball toss to self horizontal and vertical 1 minute reps and body wall rolls 4 reps supported with EO, VOR X 2     Consulted and Agree with Plan of Care  Patient             Patient will benefit from skilled therapeutic intervention in order to improve the following deficits and impairments:     Visit Diagnosis: No diagnosis found.     Problem List Patient Active Problem List   Diagnosis Date Noted  . Barrett esophagus 11/28/2016  . Celiac disease 11/28/2016  . GERD (gastroesophageal reflux disease) 11/28/2016  . Sleep apnea 11/28/2016  . History of aortic valve replacement 09/25/2016  . Palpitations 09/09/2016  . Encounter for management of temporary pacemaker 08/29/2016  . Metabolic acidosis 18/28/8337  . S/P aortic valve replacement with bioprosthetic valve 08/29/2016  .  Leg length discrepancy 04/06/2015  . Tendinopathy of left gluteus medius 02/25/2015  . Left hip pain 12/22/2014  . Spondylosis of lumbar region without myelopathy or radiculopathy 12/22/2014  . COPD with emphysema ([place]) 12/09/2014  . Stenosis of lumbosacral spine 11/10/2014  . 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 AS (aortic stenosis) 09/22/2013  . Bursitis of left hip 02/02/2013  . Atherosclerosis of native artery of extremity with intermittent claudication ([place]) 12/28/2012  . Facet arthritis of lumbar region ([place]) 12/01/2012  . DDD (degenerative disc disease), lumbar 11/23/2012  . Low back pain 11/23/2012  . Dyslipidemia 04/09/2012  . Hypertension 04/09/2012  . Paroxysmal atrial fibrillation ([place]) 04/09/2012  . Pulmonary nodule 12/05/2011   Lady Deutscher PT, DPT [phone] Lady Deutscher 07/14/2017, 4:12 PM  [place] MAIN [facility] SERVICES [address] [place], [place], [address] Phone: [phone]   Fax:  [phone]  Name: Jeremy Mason MRN: [id number] Date of Birth: [birth year]

## 2017-07-23 ENCOUNTER — Encounter: Payer: Self-pay | Admitting: Physical Therapy

## 2017-07-23 ENCOUNTER — Ambulatory Visit: Payer: Medicare Other | Admitting: Physical Therapy

## 2017-07-23 DIAGNOSIS — R42 Dizziness and giddiness: Secondary | ICD-10-CM | POA: Insufficient documentation

## 2017-07-23 DIAGNOSIS — R2681 Unsteadiness on feet: Secondary | ICD-10-CM

## 2017-07-23 NOTE — Therapy (Signed)
[place] MAIN [facility] SERVICES [address] [place], [place], [address] Phone: [phone]   Fax:  [phone]  Physical Therapy Treatment  Patient Details  Name: Jeremy Mason MRN: [id number] Date of Birth: September 12, 1941 Referring Provider: Jennings Books MD  Encounter Date: 07/23/2017      PT End of Session - 07/23/17 1437    Visit Number 20   Number of Visits 34   Date for PT Re-Evaluation 09/05/17   Authorization Type 10/10   PT Start Time 1425   PT Stop Time 1520   PT Time Calculation (min) 55 min   Equipment Utilized During Treatment Gait belt   Activity Tolerance Patient tolerated treatment well  pt did required rest break secondary to headache/dizziness symptoms during re-testing   Behavior During Therapy [facility] for tasks assessed/performed      Past Medical History:  Diagnosis Date  . Aortic aneurysm ([place])   . Aortic stenosis   . Arthritis    hips, legs  . Atrial fibrillation ([place])   . Barrett's esophagus   . Celiac disease   . COPD (chronic obstructive pulmonary disease) ([place])   . GERD (gastroesophageal reflux disease)   . Heart murmur   . History of hiatal hernia   . Hypertension   . PONV (postoperative nausea and vomiting)    nausea after cataract  . Sleep apnea    no CPAP  . Vertigo    no episodes - 2-3 yrs    Past Surgical History:  Procedure Laterality Date  . CARDIAC CATHETERIZATION    . CARDIAC ELECTROPHYSIOLOGY STUDY AND ABLATION    . CARDIOVERSION    . CATARACT EXTRACTION W/PHACO Right 03/13/2016   Procedure: CATARACT EXTRACTION PHACO AND INTRAOCULAR LENS PLACEMENT (IOC);  Surgeon: Leandrew Koyanagi, MD;  Location: [place];  Service: Ophthalmology;  Laterality: Right;  sleep apnea - no CPAP  . CATARACT EXTRACTION W/PHACO Left 04/10/2016   Procedure: CATARACT EXTRACTION PHACO AND INTRAOCULAR LENS PLACEMENT ([place]) left eye;  Surgeon: Leandrew Koyanagi, MD;  Location: [place];   Service: Ophthalmology;  Laterality: Left;  . COLONOSCOPY    . TUMOR EXCISION     hip bone    There were no vitals filed for this visit.      Subjective Assessment - 07/23/17 1427    Subjective Patient states he still wants to get to the background to find out why is having headaches and dizziness. Patient reports he has the lidocaine injection tomorrow and a cardiologist appointment next week.    Pertinent History R sided Hip Surgery surgery in 1974 with lead to decreased activation of R hip musculature, Heart valve replacement   Limitations Lifting   Patient Stated Goals To improve balance, reduce dizziness and headache symptoms   Currently in Pain? Yes   Pain Score 5   took 2 Tylenols at 12pm   Pain Location Head   Pain Descriptors / Indicators Aching   Pain Type Chronic pain   Pain Onset Today  few days      Neuromuscular Re-education:  Patient arrives with reports of 5/10 headache this date. Discussed with patient about trying to do exercises that reproduce mild dizziness symptoms without increasing the headache intensity if able.  Patient brought home exercise program and asked a question about the ball circles activity as he reports that this activity usually brings on or intensifies his headache symptoms. Talked about modifying task so that he performs in sitting rather than standing and to  decrease the amount of excursion of ball circles to see if he is better able to tolerate. Instructed patient that if he was doing any of the HEP at home and it was increasing his headache symptoms to skip that exercise and to report that at next session.   Active eye movement between two targets: Patient performed in sitting active eye movements between two targets horizontal 3 reps of 60 seconds each and 1 rep of vertical targets. Patient reports that the vertical targets increased his headache symptoms so deferred further trials this date. Patient reports 2/10 dizziness with this  activity.  Ball Sorting Activity: On firm surface, performed transferring multicolored balls from bin placed on floor to another bin placed 180 degrees on the other side of patient at chest high level while patient visually tracks the ball so that patient is required to turn 180 degrees to the Left and Right to turn to bend over and pick and place balls in the bins. Pt without evidence of imbalance. Patient reports 3-4/10 dizziness with this activity and states this activity is more challenging because he has to watch the object while turing 180 degrees.  Discussed modifying this activity for home exercise program by placing a laundry basket on the floor to the Left and Right of patient and having patient transfer individual clothing items from one basket to the other or could use playing cards laid out on surface and transfer to another surface.  Card Sorting Activity:  Patient performed deck of card sorting activity on mat table sorting by numbers and then turning to place cards on low chair to incorporate horizontal and vertical head turning and body turns as these are symptom aggravating moves for patient.  Patient reports 2/10 dizziness with this activity.   Ambulation with head turns:  Patient performed multiple reps of ambulating 400' while scanning for targets as called out by therapist in busy visual environment (medical mall) and head turns horizontally, vertically and diagonals. Patient had increased difficulty with the diagonal head turns with veering of walking and change in gait speed noted. Patient required standing rest break after diagonals.       PT Education - 07/23/17 1432    Education provided Yes   Education Details discussed HEP   Person(s) Educated Patient   Methods Explanation;Demonstration   Comprehension Verbalized understanding             PT Long Term Goals - 07/11/17 1039      PT LONG TERM GOAL #1   Title Patient will be independent with HEP  indepednently for self-management   Baseline HEP continues to progress,    Time 4   Period Weeks   Status Partially Met     PT LONG TERM GOAL #2   Title Patient will demonstrate reduced falls risk as evidenced by Dynamic Gait Index (DGI) 21/24 or greater.   Baseline 23/24 DGI; 02/12/17: 24/24; scored 19/24 on 07/11/17   Time 8   Period Weeks   Status Achieved     PT LONG TERM GOAL #3   Title Patient will be able to walk in tandem for 70f to demonstrate significant improvement in dynamic balance and improved standing balance.   Baseline Patient steps out for support 7 times during test; 02/12/17: >20' without touching; demonstrated greater than 5 steps out for support on 07/11/17   Time 8   Period Weeks   Status Partially Met     PT LONG TERM GOAL #4   Title Patient  will be able to perform SLS for >10 sec to demonstrate significant improvement in static balance and ability to wash his LE's in the shower.    Baseline 8 secs B SLS; 02/12/17: 14.4s L/>30s R; 21 seconds L and R SLS on 07/11/2017   Time 8   Period Weeks   Status --     PT LONG TERM GOAL #5   Title Patient will reduce perceived disability to low levels as indicated by <40 on Dizziness Handicap Inventory to demonstrate significant improvement in dizziness.   Baseline DHI: 54; 02/12/17: 24/100, 6/13; 36/100 7/26: 40/100; scored 50/100 on 07/11/17   Time 8   Period Weeks   Status On-going     PT LONG TERM GOAL #6   Title --   Baseline --   Time --   Period --   Status --               Plan - 07/23/17 1438    Clinical Impression Statement Patient challenged by active eye movement between two target, ball sorting and ambulation with diagonal head turns this date. Patient reporting good compliance with HEP. Patient demonstrated good balance during activities this date but did not veering with ambulation with diagonal head turns and patient would benefit from repeated trials of this activity. Patient reports that he  is going to have the lidocaine procedure next week in order to try to help alleviate his headaches which might in turn impact his dizziness symptoms. Will continue to follow and work towards remaining goals as set on plan of care.     Rehab Potential Fair   Clinical Impairments Affecting Rehab Potential (+) highly motivated, family support (-) Symptoms evolving, >3 coormorbitites   PT Frequency 1x / week   PT Duration 8 weeks   PT Treatment/Interventions Gait training;Stair training;Functional mobility training;Therapeutic activities;Balance training;Therapeutic exercise;Patient/family education;Neuromuscular re-education;Canalith Repostioning;Vestibular   PT Next Visit Plan Review HEP, consider VOR X2, active eye movement between 2 targets, card sorting activity   PT Home Exercise Plan VOR x 1 in standing with feet apart, conflicting background 01V x 3, 3x/day, Semitandem balance with horizontal head turns 30s/each LE x 3 each, 3 times/day; ball toss to self horizontal and vertical 1 minute reps and body wall rolls 4 reps supported with EO, VOR X 2    Consulted and Agree with Plan of Care Patient      Patient will benefit from skilled therapeutic intervention in order to improve the following deficits and impairments:  Decreased balance, Decreased activity tolerance, Decreased endurance, Decreased strength, Decreased coordination, Difficulty walking, Decreased mobility, Dizziness, Increased fascial restricitons  Visit Diagnosis: Dizziness and giddiness  Unsteadiness on feet       G-Codes - 08/23/17 1503    Functional Assessment Tool Used (Outpatient Only) DGI, DHI, clinical judgment, tandem stance, ABC scale, SLS   Functional Limitation Mobility: Walking and moving around   Mobility: Walking and Moving Around Current Status [phone]) At least 20 percent but less than 40 percent impaired, limited or restricted   Mobility: Walking and Moving Around Goal Status [phone]) At least 1 percent but  less than 20 percent impaired, limited or restricted      Problem List Patient Active Problem List   Diagnosis Date Noted  . Barrett esophagus 11/28/2016  . Celiac disease 11/28/2016  . GERD (gastroesophageal reflux disease) 11/28/2016  . Sleep apnea 11/28/2016  . History of aortic valve replacement 09/25/2016  . Palpitations 09/09/2016  . Encounter for management of temporary  pacemaker 08/29/2016  . Metabolic acidosis 02/07/3357  . S/P aortic valve replacement with bioprosthetic valve 08/29/2016  . Leg length discrepancy 04/06/2015  . Tendinopathy of left gluteus medius 02/25/2015  . Left hip pain 12/22/2014  . Spondylosis of lumbar region without myelopathy or radiculopathy 12/22/2014  . COPD with emphysema ([place]) 12/09/2014  . Stenosis of lumbosacral spine 11/10/2014  . 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 AS (aortic stenosis) 09/22/2013  . Bursitis of left hip 02/02/2013  . Atherosclerosis of native artery of extremity with intermittent claudication ([place]) 12/28/2012  . Facet arthritis of lumbar region ([place]) 12/01/2012  . DDD (degenerative disc disease), lumbar 11/23/2012  . Low back pain 11/23/2012  . Dyslipidemia 04/09/2012  . Hypertension 04/09/2012  . Paroxysmal atrial fibrillation ([place]) 04/09/2012  . Pulmonary nodule 12/05/2011   Lady Deutscher PT, DPT [phone] Lady Deutscher 07/24/2017, 3:08 PM  [place] MAIN [facility] SERVICES [address] [place], [place], [address] Phone: [phone]   Fax:  [phone]  Name: Jeremy Mason MRN: [id number] Date of Birth: 12/31/1940

## 2017-07-25 ENCOUNTER — Encounter: Payer: Medicare Other | Admitting: Physical Therapy

## 2017-07-29 ENCOUNTER — Ambulatory Visit: Payer: Medicare Other | Attending: Neurology | Admitting: Physical Therapy

## 2017-07-29 ENCOUNTER — Encounter: Payer: Self-pay | Admitting: Physical Therapy

## 2017-07-29 DIAGNOSIS — R2681 Unsteadiness on feet: Secondary | ICD-10-CM

## 2017-07-29 DIAGNOSIS — R42 Dizziness and giddiness: Secondary | ICD-10-CM | POA: Insufficient documentation

## 2017-07-29 NOTE — Therapy (Signed)
[place] MAIN [facility] SERVICES [address] [place], [place], [address] Phone: [phone]   Fax:  [phone]  Physical Therapy Treatment  Patient Details  Name: Jeremy Mason MRN: [id number] Date of Birth: [birth year] Referring Provider: Jennings Books MD   Encounter Date: 07/29/2017  PT End of Session - 07/29/17 1443    Visit Number  21    Number of Visits  34    Date for PT Re-Evaluation  09/05/17    Authorization Type  11/20    PT Start Time  1433    Equipment Utilized During Treatment  Gait belt    Activity Tolerance  Patient tolerated treatment well pt did required rest break secondary to headache/dizziness symptoms during re-testing   pt did required rest break secondary to headache/dizziness symptoms during re-testing   Behavior During Therapy  [facility] for tasks assessed/performed       Past Medical History:  Diagnosis Date  . Aortic aneurysm ([place])   . Aortic stenosis   . Arthritis    hips, legs  . Atrial fibrillation ([place])   . Barrett's esophagus   . Celiac disease   . COPD (chronic obstructive pulmonary disease) ([place])   . GERD (gastroesophageal reflux disease)   . Heart murmur   . History of hiatal hernia   . Hypertension   . PONV (postoperative nausea and vomiting)    nausea after cataract  . Sleep apnea    no CPAP  . Vertigo    no episodes - 2-3 yrs    Past Surgical History:  Procedure Laterality Date  . CARDIAC CATHETERIZATION    . CARDIAC ELECTROPHYSIOLOGY STUDY AND ABLATION    . CARDIOVERSION    . COLONOSCOPY    . TUMOR EXCISION     hip bone    There were no vitals filed for this visit.  Subjective Assessment - 07/29/17 1438    Subjective  Patient reports he saw a vascular doctor at [facility] yesterday and that he has an MRI with contrast of the brain ordered and saw his cardiologist today. Patient got the lidocaine injection last week and said that he would probably start to see results after having the  procedue done multiple times.     Pertinent History  R sided Hip Surgery surgery in 1974 with lead to decreased activation of R hip musculature, Heart valve replacement    Limitations  Lifting    Patient Stated Goals  To improve balance, reduce dizziness and headache symptoms    Currently in Pain?  Yes    Pain Score  4     Pain Location  Head    Pain Onset  Today few days   few days      Neuromuscular Re-education:  Active eye movement between two targets: Patient performed in sitting active eye movements between two targets with busy background horizontal and vertical 2 reps of 1 minute each. Patient demonstrated good technique except required cuing for speed. Patient reported 2-3/10 dizziness with this activity and reported that he did better with the vertical targets as compared to last week.  Card Sorting Activity:  While standing on red foam mat, patient performed deck of card sorting activity on mat table sorting by numbers and then turning to place cards on table to incorporate horizontal and vertical head turning and body turns.  Patient reports mild dizziness with this activity and imbalance.     Ball Sorting Activity: On firm surface, performed transferring multicolored balls from  bin placed on floor to another bin placed 180 degrees on the other side of patient while patient visually tracks the ball so that patient is required to turn 180 degrees Left and Right to turn to bend over and pick and place balls in the bins with CGA. Pt with evidence of mild imbalance that patient was able to self-correct and patient reports increase in symptoms with this activity. Patient reports 6/10 dizziness.     Ambulation with head turns:  Patient performed 106' trials of forwards ambulation with diagonal head turns with CGA. Patient had several episodes of veering and noted decreased cadence.  Patient reported increase in sensation of imbalance and dizziness with this activity.     PT  Education - 07/29/17 1443    Education provided  Yes    Person(s) Educated  Patient    Methods  Explanation          PT Long Term Goals - 07/11/17 1039      PT LONG TERM GOAL #1   Title  Patient will be independent with HEP indepednently for self-management    Baseline  HEP continues to progress,     Time  4    Period  Weeks    Status  Partially Met      PT LONG TERM GOAL #2   Title  Patient will demonstrate reduced falls risk as evidenced by Dynamic Gait Index (DGI) 21/24 or greater.    Baseline  23/24 DGI; 02/12/17: 24/24; scored 19/24 on 07/11/17    Time  8    Period  Weeks    Status  Achieved      PT LONG TERM GOAL #3   Title  Patient will be able to walk in tandem for 59f to demonstrate significant improvement in dynamic balance and improved standing balance.    Baseline  Patient steps out for support 7 times during test; 02/12/17: >20' without touching; demonstrated greater than 5 steps out for support on 07/11/17    Time  8    Period  Weeks    Status  Partially Met      PT LONG TERM GOAL #4   Title  Patient will be able to perform SLS for >10 sec to demonstrate significant improvement in static balance and ability to wash his LE's in the shower.     Baseline  8 secs B SLS; 02/12/17: 14.4s L/>30s R; 21 seconds L and R SLS on 07/11/2017    Time  8    Period  Weeks    Status  --      PT LONG TERM GOAL #5   Title  Patient will reduce perceived disability to low levels as indicated by <40 on Dizziness Handicap Inventory to demonstrate significant improvement in dizziness.    Baseline  DHI: 54; 02/12/17: 24/100, 6/13; 36/100 7/26: 40/100; scored 50/100 on 07/11/17    Time  8    Period  Weeks    Status  On-going      PT LONG TERM GOAL #6   Title  --    Baseline  --    Time  --    Period  --    Status  --            Plan - 08/01/17 1010    Clinical Impression Statement  Patient reproting increased dizziness and imbalance with adding progression of standing on  red foam mat while doing ball sorting and card sorting activities. In addition, patient conitnues  to be challenged by diagonal head turning activities. Patient would benefit from continued PT services to try to further reduce subjective symptoms and work towards goals.     Rehab Potential  Fair    Clinical Impairments Affecting Rehab Potential  (+) highly motivated, family support (-) Symptoms evolving, >3 coormorbitites    PT Frequency  1x / week    PT Duration  8 weeks    PT Treatment/Interventions  Gait training;Stair training;Functional mobility training;Therapeutic activities;Balance training;Therapeutic exercise;Patient/family education;Neuromuscular re-education;Canalith Repostioning;Vestibular    PT Next Visit Plan  Review HEP, consider VOR X2, active eye movement between 2 targets, card sorting activity    PT Home Exercise Plan  VOR x 1 in standing with feet apart, conflicting background 98S x 3, 3x/day, Semitandem balance with horizontal head turns 30s/each LE x 3 each, 3 times/day; ball toss to self horizontal and vertical 1 minute reps and body wall rolls 4 reps supported with EO, VOR X 2     Consulted and Agree with Plan of Care  Patient       Patient will benefit from skilled therapeutic intervention in order to improve the following deficits and impairments:  Decreased balance, Decreased activity tolerance, Decreased endurance, Decreased strength, Decreased coordination, Difficulty walking, Decreased mobility, Dizziness, Increased fascial restricitons  Visit Diagnosis: Dizziness and giddiness  Unsteadiness on feet     Problem List Patient Active Problem List   Diagnosis Date Noted  . Barrett esophagus 11/28/2016  . Celiac disease 11/28/2016  . GERD (gastroesophageal reflux disease) 11/28/2016  . Sleep apnea 11/28/2016  . History of aortic valve replacement 09/25/2016  . Palpitations 09/09/2016  . Encounter for management of temporary pacemaker 08/29/2016  . Metabolic  acidosis 96/70/0047  . S/P aortic valve replacement with bioprosthetic valve 08/29/2016  . Leg length discrepancy 04/06/2015  . Tendinopathy of left gluteus medius 02/25/2015  . Left hip pain 12/22/2014  . Spondylosis of lumbar region without myelopathy or radiculopathy 12/22/2014  . COPD with emphysema ([place]) 12/09/2014  . Stenosis of lumbosacral spine 11/10/2014  . 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 AS (aortic stenosis) 09/22/2013  . Bursitis of left hip 02/02/2013  . Atherosclerosis of native artery of extremity with intermittent claudication ([place]) 12/28/2012  . Facet arthritis of lumbar region ([place]) 12/01/2012  . DDD (degenerative disc disease), lumbar 11/23/2012  . Low back pain 11/23/2012  . Dyslipidemia 04/09/2012  . Hypertension 04/09/2012  . Paroxysmal atrial fibrillation ([place]) 04/09/2012  . Pulmonary nodule 12/05/2011   Lady Deutscher PT, DPT [phone] Lady Deutscher 07/29/2017, 2:44 PM  [place] MAIN [facility] SERVICES [address] [place], [place], [address] Phone: [phone]   Fax:  [phone]  Name: Jeremy Mason MRN: [id number] Date of Birth: Aug 14, 1941

## 2017-08-01 ENCOUNTER — Encounter: Payer: Medicare Other | Admitting: Physical Therapy

## 2017-08-08 ENCOUNTER — Encounter: Payer: Self-pay | Admitting: Physical Therapy

## 2017-08-08 ENCOUNTER — Ambulatory Visit: Payer: Medicare Other | Attending: Neurology | Admitting: Physical Therapy

## 2017-08-08 DIAGNOSIS — R2681 Unsteadiness on feet: Secondary | ICD-10-CM

## 2017-08-08 DIAGNOSIS — R42 Dizziness and giddiness: Secondary | ICD-10-CM

## 2017-08-08 NOTE — Therapy (Addendum)
[place] MAIN [facility] SERVICES [address] [place], [place], [address] Phone: [phone]   Fax:  [phone]  Physical Therapy Treatment  Patient Details  Name: Jeremy Mason MRN: [id number] Date of Birth: 02-19-1941 Referring Provider: Jennings Books MD   Encounter Date: 08/08/2017  PT End of Session - 08/08/17 1107    Visit Number  21    Number of Visits  34    Date for PT Re-Evaluation  09/05/17    Authorization Type  3/10    PT Start Time  1104    PT Stop Time  1200    PT Time Calculation (min)  56 min    Equipment Utilized During Treatment  Gait belt    Activity Tolerance  Patient tolerated treatment well pt did required rest break secondary to headache/dizziness symptoms during re-testing    Behavior During Therapy  [facility] for tasks assessed/performed       Past Medical History:  Diagnosis Date  . Aortic aneurysm ([place])   . Aortic stenosis   . Arthritis    hips, legs  . Atrial fibrillation ([place])   . Barrett's esophagus   . Celiac disease   . COPD (chronic obstructive pulmonary disease) ([place])   . GERD (gastroesophageal reflux disease)   . Heart murmur   . History of hiatal hernia   . Hypertension   . PONV (postoperative nausea and vomiting)    nausea after cataract  . Sleep apnea    no CPAP  . Vertigo    no episodes - 2-3 yrs    Past Surgical History:  Procedure Laterality Date  . CARDIAC CATHETERIZATION    . CARDIAC ELECTROPHYSIOLOGY STUDY AND ABLATION    . CARDIOVERSION    . CATARACT EXTRACTION PHACO AND INTRAOCULAR LENS PLACEMENT ([place]) Right 03/13/2016   Performed by Leandrew Koyanagi, MD at [place]  . CATARACT EXTRACTION PHACO AND INTRAOCULAR LENS PLACEMENT ([place]) left eye Left 04/10/2016   Performed by Leandrew Koyanagi, MD at [place]  . COLONOSCOPY    . TUMOR EXCISION     hip bone    There were no vitals filed for this visit.  Subjective Assessment - 08/08/17 1106    Subjective   Patient states he had his third lidocaine injection yesterday. Patient states he has seen a little improvement. Patient states this morning his headache is a little better and states it is "almost like a sinus".     Pertinent History  R sided Hip Surgery surgery in 1974 with lead to decreased activation of R hip musculature, Heart valve replacement    Limitations  Lifting    Patient Stated Goals  To improve balance, reduce dizziness and headache symptoms    Currently in Pain?  Yes    Pain Score  2     Pain Location  Head    Pain Type  Chronic pain    Pain Onset  Today few days        Neuromuscular Re-education:  VOR x 2 exercise:  Demonstrated and educated as to VOR X 2 with conflicting background. Patient performed VOR X 2 horizontal and vertical with conflicting background in standing multiple reps of 1 minute each with verbal cues for technique.  Patient reports 1-2 /10 dizziness. Attempted in sitting to do VOR X 2 horizontal and diagonal with patient using a face card from deck of playing cards as target and patient reports this increased his dizziness symptoms and was difficult. Will  repeat next session.   Ambulation with head turns:  Patient performed 66' trials of forwards and retro ambulation with diagonal head turns with CGA. Patient with evidence of mild imbalance and veering with diagonal head turns especially when coupled with retro ambulation.   Airex pad:  On Airex pad, patient performed  Patient reports no dizziness with this activity.    Word Scanning: On firm and then on Airex pad, performed scanning for letters written on mirror to spell out words as called out by therapist. Patient reports that this activity does not create dizziness.    Time spent discussing: plan of care and progress towards improving symptoms. Patient reports increased life stressors as his wife will be undergoing some medical treatments at the end of the month. Patient did report that he felt  that his symptoms are 40% better as compared to last time.   PT Education - 08/08/17 1107    Education provided  Yes    Education Details  discussed plan of care and patient reported progress on symptoms; added VOR X 2 with conflicting background to HEP    Person(s) Educated  Patient    Methods  Explanation;Demonstration;Handout    Comprehension  Verbalized understanding;Returned demonstration          PT Long Term Goals - 07/11/17 1039      PT LONG TERM GOAL #1   Title  Patient will be independent with HEP indepednently for self-management    Baseline  HEP continues to progress,     Time  4    Period  Weeks    Status  Partially Met      PT LONG TERM GOAL #2   Title  Patient will demonstrate reduced falls risk as evidenced by Dynamic Gait Index (DGI) 21/24 or greater.    Baseline  23/24 DGI; 02/12/17: 24/24; scored 19/24 on 07/11/17    Time  8    Period  Weeks    Status  Achieved      PT LONG TERM GOAL #3   Title  Patient will be able to walk in tandem for 55f to demonstrate significant improvement in dynamic balance and improved standing balance.    Baseline  Patient steps out for support 7 times during test; 02/12/17: >20' without touching; demonstrated greater than 5 steps out for support on 07/11/17    Time  8    Period  Weeks    Status  Partially Met      PT LONG TERM GOAL #4   Title  Patient will be able to perform SLS for >10 sec to demonstrate significant improvement in static balance and ability to wash his LE's in the shower.     Baseline  8 secs B SLS; 02/12/17: 14.4s L/>30s R; 21 seconds L and R SLS on 07/11/2017    Time  8    Period  Weeks    Status  --      PT LONG TERM GOAL #5   Title  Patient will reduce perceived disability to low levels as indicated by <40 on Dizziness Handicap Inventory to demonstrate significant improvement in dizziness.    Baseline  DHI: 54; 02/12/17: 24/100, 6/13; 36/100 7/26: 40/100; scored 50/100 on 07/11/17    Time  8    Period   Weeks    Status  On-going      PT LONG TERM GOAL #6   Title  --    Baseline  --    Time  --  Period  --    Status  --            Plan - 08/08/17 1107    Clinical Impression Statement  Patient reporting that he feels that his symptoms are 40% better as compared to last time. Patient reports compliance with HEP and states that he has had 3 of the SphenoCath lidocaine treatments which he feels have also helped improve his migraine symptoms. Patient did have increase in dizziness symptoms and imbalance with ambulation with diagonal head turns and was challenged by VOR X 2 in standing with conflicting background. Will assess progress towards goals next session and repeat funcitonal outcome testing.      Rehab Potential  Fair    Clinical Impairments Affecting Rehab Potential  (+) highly motivated, family support (-) Symptoms evolving, >3 coormorbitites    PT Frequency  1x / week    PT Duration  8 weeks    PT Treatment/Interventions  Gait training;Stair training;Functional mobility training;Therapeutic activities;Balance training;Therapeutic exercise;Patient/family education;Neuromuscular re-education;Canalith Repostioning;Vestibular    PT Next Visit Plan  Review HEP, consider VOR X2, active eye movement between 2 targets, card sorting activity    PT Home Exercise Plan  VOR x 1 in standing with feet apart, conflicting background 25K x 3, 3x/day, Semitandem balance with horizontal head turns 30s/each LE x 3 each, 3 times/day; ball toss to self horizontal and vertical 1 minute reps and body wall rolls 4 reps supported with EO, VOR X 2 with conflicting background in standing    Consulted and Agree with Plan of Care  Patient       Patient will benefit from skilled therapeutic intervention in order to improve the following deficits and impairments:  Decreased balance, Decreased activity tolerance, Decreased endurance, Decreased strength, Decreased coordination, Difficulty walking, Decreased  mobility, Dizziness, Increased fascial restricitons  Visit Diagnosis: Dizziness and giddiness  Unsteadiness on feet     Problem List Patient Active Problem List   Diagnosis Date Noted  . Barrett esophagus 11/28/2016  . Celiac disease 11/28/2016  . GERD (gastroesophageal reflux disease) 11/28/2016  . Sleep apnea 11/28/2016  . History of aortic valve replacement 09/25/2016  . Palpitations 09/09/2016  . Encounter for management of temporary pacemaker 08/29/2016  . Metabolic acidosis 27/02/2375  . S/P aortic valve replacement with bioprosthetic valve 08/29/2016  . Leg length discrepancy 04/06/2015  . Tendinopathy of left gluteus medius 02/25/2015  . Left hip pain 12/22/2014  . Spondylosis of lumbar region without myelopathy or radiculopathy 12/22/2014  . COPD with emphysema ([place]) 12/09/2014  . Stenosis of lumbosacral spine 11/10/2014  . 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 AS (aortic stenosis) 09/22/2013  . Bursitis of left hip 02/02/2013  . Atherosclerosis of native artery of extremity with intermittent claudication ([place]) 12/28/2012  . Facet arthritis of lumbar region ([place]) 12/01/2012  . DDD (degenerative disc disease), lumbar 11/23/2012  . Low back pain 11/23/2012  . Dyslipidemia 04/09/2012  . Hypertension 04/09/2012  . Paroxysmal atrial fibrillation ([place]) 04/09/2012  . Pulmonary nodule 12/05/2011    Lady Deutscher PT, DPT [phone] Lady Deutscher 08/08/2017, 12:51 PM  [place] MAIN [facility] SERVICES [address] [place], [place], [address] Phone: [phone]   Fax:  [phone]  Name: Jeremy Mason MRN: [id number] Date of Birth: 07-Sep-1941

## 2017-08-12 ENCOUNTER — Ambulatory Visit: Payer: Medicare Other | Admitting: Physical Therapy

## 2017-08-12 ENCOUNTER — Encounter: Payer: Self-pay | Admitting: Physical Therapy

## 2017-08-12 VITALS — BP 123/70

## 2017-08-12 DIAGNOSIS — R42 Dizziness and giddiness: Secondary | ICD-10-CM

## 2017-08-12 DIAGNOSIS — R2681 Unsteadiness on feet: Secondary | ICD-10-CM

## 2017-08-12 NOTE — Therapy (Signed)
[place] MAIN [facility] SERVICES [address] [place], [place], [address] Phone: [phone]   Fax:  [phone]  Physical Therapy Treatment  Patient Details  Name: Jeremy Mason MRN: [id number] Date of Birth: Jul 19, 1941 Referring Provider: Jennings Books MD   Encounter Date: 08/12/2017  PT End of Session - 08/12/17 0839    Visit Number  22    Number of Visits  34    Date for PT Re-Evaluation  09/05/17    Authorization Type  4/10    PT Start Time  0829    PT Stop Time  0925    PT Time Calculation (min)  56 min    Equipment Utilized During Treatment  Gait belt    Activity Tolerance  Patient tolerated treatment well pt did required rest break secondary to headache/dizziness symptoms during re-testing    Behavior During Therapy  [facility] for tasks assessed/performed       Past Medical History:  Diagnosis Date  . Aortic aneurysm ([place])   . Aortic stenosis   . Arthritis    hips, legs  . Atrial fibrillation ([place])   . Barrett's esophagus   . Celiac disease   . COPD (chronic obstructive pulmonary disease) ([place])   . GERD (gastroesophageal reflux disease)   . Heart murmur   . History of hiatal hernia   . Hypertension   . PONV (postoperative nausea and vomiting)    nausea after cataract  . Sleep apnea    no CPAP  . Vertigo    no episodes - 2-3 yrs    Past Surgical History:  Procedure Laterality Date  . CARDIAC CATHETERIZATION    . CARDIAC ELECTROPHYSIOLOGY STUDY AND ABLATION    . CARDIOVERSION    . CATARACT EXTRACTION W/PHACO Right 03/13/2016   Procedure: CATARACT EXTRACTION PHACO AND INTRAOCULAR LENS PLACEMENT (IOC);  Surgeon: Leandrew Koyanagi, MD;  Location: [place];  Service: Ophthalmology;  Laterality: Right;  sleep apnea - no CPAP  . CATARACT EXTRACTION W/PHACO Left 04/10/2016   Procedure: CATARACT EXTRACTION PHACO AND INTRAOCULAR LENS PLACEMENT ([place]) left eye;  Surgeon: Leandrew Koyanagi, MD;  Location: [place];  Service: Ophthalmology;  Laterality: Left;  . COLONOSCOPY    . TUMOR EXCISION     hip bone    Vitals:   08/12/17 0834  BP: 123/70    Subjective Assessment - 08/12/17 0834    Subjective  Patient reports that he did well on Friday but that Saturday, [place] and Monday were some of the worst days that he has had in regards to his headache symptoms.     Pertinent History  R sided Hip Surgery surgery in 1974 with lead to decreased activation of R hip musculature, Heart valve replacement    Limitations  Lifting    Patient Stated Goals  To improve balance, reduce dizziness and headache symptoms    Currently in Pain?  Yes    Pain Score  2     Pain Location  Head    Pain Type  Chronic pain    Pain Onset  Today few days    Pain Frequency  Several days a week       Neuromuscular Re-education:  VOR x 2 exercise:  Patient performed VOR X 2 horizontal in standing with conflicting background with the target being a playing card 3 reps of 1 minute each with verbal cues for technique.  Patient reports /10 dizziness.   FUNCTIONAL OUTCOME MEASURES:  Results Comments  DHI 40/100 Moderate perception of handicap; in need of intervention  ABC Scale 37.18% Falls risk; in need of intervention  DGI 21/24 Falls risk; in need of intervention   Discussed functional outcome testing and compared to prior testing. Discussed progress towards goals and discharge plans.   Tandem walking: Patient performed tandem walking 20' times 2 reps. Patient stepped off line twice the first rep and once the second rep.   Single leg stance:  Worked on static single stance without UEs support. Patient able to perform 5 and 8 seconds with best attempts.    PT Education - 08/12/17 [phone]    Education provided  Yes    Education Details  discussed functional outcome measures results and compared to prior testing, discussed plan to discharge at next session    Person(s) Educated  Patient    Methods   Explanation;Handout    Comprehension  Verbalized understanding;Returned demonstration          PT Long Term Goals - 08/12/17 0839      PT LONG TERM GOAL #1   Title  Patient will be independent with HEP indepednently for self-management    Baseline  HEP continues to progress,     Time  4    Period  Weeks    Status  Achieved      PT LONG TERM GOAL #2   Title  Patient will demonstrate reduced falls risk as evidenced by Dynamic Gait Index (DGI) 21/24 or greater.    Baseline  23/24 DGI; 02/12/17: 24/24; scored 19/24 on 07/11/17; 11/20 scored 21/24    Time  8    Period  Weeks    Status  Achieved      PT LONG TERM GOAL #3   Title  Patient will be able to walk in tandem for 74f to demonstrate significant improvement in dynamic balance and improved standing balance.    Baseline  Patient steps out for support 7 times during test; 02/12/17: >20' without touching; demonstrated greater than 5 steps out for support on 07/11/17    Time  8    Period  Weeks    Status  Achieved      PT LONG TERM GOAL #4   Title  Patient will be able to perform SLS for >10 sec to demonstrate significant improvement in static balance and ability to wash his LE's in the shower.     Baseline  8 secs B SLS; 02/12/17: 14.4s L/>30s R; 21 seconds L and R SLS on 07/11/2017    Time  8    Period  Weeks    Status  Partially Met      PT LONG TERM GOAL #5   Title  Patient will reduce perceived disability to low levels as indicated by <40 on Dizziness Handicap Inventory to demonstrate significant improvement in dizziness.    Baseline  DHI: 571 02/12/17: 24/100, 6/13; 36/100 7/26: 40/100; scored 50/100 on 07/11/17    Time  8    Period  Weeks    Status  Partially Met            Plan - 08/12/17 0840    Clinical Impression Statement  Performed functional outcome testing this date. Patient [facility] from 50/100 [facility] which indicates moderate perception of handicap. Patient's ABC scale score did not  statistically significantly change from last testing and was 37.5%. Patient scored 21/24 on the DGI. Patient has partially met 2/5 goals and met 3/5 goals as set  on plan of care. Patient would like to have one more physical therapy session to review final home exercise program. Will plan on discharging from PT services at next visit.     Rehab Potential  Fair    Clinical Impairments Affecting Rehab Potential  (+) highly motivated, family support (-) Symptoms evolving, >3 coormorbitites    PT Frequency  1x / week    PT Duration  8 weeks    PT Treatment/Interventions  Gait training;Stair training;Functional mobility training;Therapeutic activities;Balance training;Therapeutic exercise;Patient/family education;Neuromuscular re-education;Canalith Repostioning;Vestibular    PT Next Visit Plan  Review HEP, consider VOR X2, active eye movement between 2 targets, card sorting activity    PT Home Exercise Plan  VOR x 1 in standing with feet apart, conflicting background 35D x 3, 3x/day, Semitandem balance with horizontal head turns 30s/each LE x 3 each, 3 times/day; ball toss to self horizontal and vertical 1 minute reps and body wall rolls 4 reps supported with EO, VOR X 2 with conflicting background in standing    Consulted and Agree with Plan of Care  Patient       Patient will benefit from skilled therapeutic intervention in order to improve the following deficits and impairments:  Decreased balance, Decreased activity tolerance, Decreased endurance, Decreased strength, Decreased coordination, Difficulty walking, Decreased mobility, Dizziness, Increased fascial restricitons  Visit Diagnosis: Dizziness and giddiness  Unsteadiness on feet     Problem List Patient Active Problem List   Diagnosis Date Noted  . Barrett esophagus 11/28/2016  . Celiac disease 11/28/2016  . GERD (gastroesophageal reflux disease) 11/28/2016  . Sleep apnea 11/28/2016  . History of aortic valve replacement 09/25/2016   . Palpitations 09/09/2016  . Encounter for management of temporary pacemaker 08/29/2016  . Metabolic acidosis 97/41/6384  . S/P aortic valve replacement with bioprosthetic valve 08/29/2016  . Leg length discrepancy 04/06/2015  . Tendinopathy of left gluteus medius 02/25/2015  . Left hip pain 12/22/2014  . Spondylosis of lumbar region without myelopathy or radiculopathy 12/22/2014  . COPD with emphysema ([place]) 12/09/2014  . Stenosis of lumbosacral spine 11/10/2014  . 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 AS (aortic stenosis) 09/22/2013  . Bursitis of left hip 02/02/2013  . Atherosclerosis of native artery of extremity with intermittent claudication ([place]) 12/28/2012  . Facet arthritis of lumbar region ([place]) 12/01/2012  . DDD (degenerative disc disease), lumbar 11/23/2012  . Low back pain 11/23/2012  . Dyslipidemia 04/09/2012  . Hypertension 04/09/2012  . Paroxysmal atrial fibrillation ([place]) 04/09/2012  . Pulmonary nodule 12/05/2011   Lady Deutscher PT, DPT [phone] Lady Deutscher 08/13/2017, 11:52 AM  [place] MAIN [facility] SERVICES [address] [place], [place], [address] Phone: [phone]   Fax:  [phone]  Name: FERRELL CLAIBORNE MRN: [id number] Date of Birth: 19-Nov-1940

## 2017-08-22 ENCOUNTER — Ambulatory Visit: Payer: Medicare Other | Admitting: Physical Therapy

## 2017-08-22 ENCOUNTER — Encounter: Payer: Self-pay | Admitting: Physical Therapy

## 2017-08-22 DIAGNOSIS — R42 Dizziness and giddiness: Secondary | ICD-10-CM | POA: Diagnosis not present

## 2017-08-22 DIAGNOSIS — R2681 Unsteadiness on feet: Secondary | ICD-10-CM

## 2017-08-22 NOTE — Therapy (Signed)
[place] MAIN [facility] SERVICES [address] [place], [place], [address] Phone: [phone]   Fax:  [phone]  Physical Therapy Treatment/Discharge Summary  Patient Details  Name: Jeremy Mason MRN: [id number] Date of Birth: 1941/01/24 Referring Provider: Jennings Books MD   Encounter Date: 08/22/2017  PT End of Session - 08/22/17 1103    Visit Number  23    Number of Visits  34    Date for PT Re-Evaluation  09/05/17    Authorization Type  5/10    PT Start Time  1104    PT Stop Time  1145    PT Time Calculation (min)  41 min    Equipment Utilized During Treatment  --    Activity Tolerance  Patient tolerated treatment well pt did required rest break secondary to headache/dizziness symptoms during re-testing    Behavior During Therapy  [facility] for tasks assessed/performed       Past Medical History:  Diagnosis Date  . Aortic aneurysm ([place])   . Aortic stenosis   . Arthritis    hips, legs  . Atrial fibrillation ([place])   . Barrett's esophagus   . Celiac disease   . COPD (chronic obstructive pulmonary disease) ([place])   . GERD (gastroesophageal reflux disease)   . Heart murmur   . History of hiatal hernia   . Hypertension   . PONV (postoperative nausea and vomiting)    nausea after cataract  . Sleep apnea    no CPAP  . Vertigo    no episodes - 2-3 yrs    Past Surgical History:  Procedure Laterality Date  . CARDIAC CATHETERIZATION    . CARDIAC ELECTROPHYSIOLOGY STUDY AND ABLATION    . CARDIOVERSION    . CATARACT EXTRACTION W/PHACO Right 03/13/2016   Procedure: CATARACT EXTRACTION PHACO AND INTRAOCULAR LENS PLACEMENT (IOC);  Surgeon: Leandrew Koyanagi, MD;  Location: [place];  Service: Ophthalmology;  Laterality: Right;  sleep apnea - no CPAP  . CATARACT EXTRACTION W/PHACO Left 04/10/2016   Procedure: CATARACT EXTRACTION PHACO AND INTRAOCULAR LENS PLACEMENT ([place]) left eye;  Surgeon: Leandrew Koyanagi, MD;  Location:  [place];  Service: Ophthalmology;  Laterality: Left;  . COLONOSCOPY    . TUMOR EXCISION     hip bone    There were no vitals filed for this visit.  Subjective Assessment - 08/22/17 1103    Subjective  Patient states that he has been doing his exercise program most days of the week but does not do it if he is having a very bad migraine headache as we discussed. Patient states [facility] and that he has a folllowup appointment with Dr. Brigitte Pulse in December.     Pertinent History  R sided Hip Surgery surgery in 1974 with lead to decreased activation of R hip musculature, Heart valve replacement    Limitations  Lifting    Patient Stated Goals  To improve balance, reduce dizziness and headache symptoms    Pain Onset  Today few days        Neuromuscular Re-education:  Active eye movement between two targets: Patient performed in standing active eye movements between two targets with conflicting background multiple 1 minute reps of horizontal, vertical and diagonal targets. Patient with verbal cuing initially for technique. Patient reports that the horizontal targets were the most difficult.  Patient report 4-5/10 dizziness with this activity and reports mild nausea.  Added this exercise for home  exercise program. Handout provided.  Stairs:  Patient reports that he does not have difficulties with ascending steps but reports descending steps increases his dizziness.  Worked on strategy to ascend steps using step over step pattern with use of handrail and then tried descending steps by turning to face the handrail and sidestepping down. Patient performed multiple reps of ascending/descending 4 steps with handrail trying to sidestep with descending the stairs and patient reported that although he still does not feel "comfortable" with steps that this method did help decrease his dizziness symptoms.   Discussed and reviewed final home exercise program.  Discussed continuing to perform home exercise program upon discharge from therapy. Patient plans on continuing to do home exercise program and staying active with his work about his home. Patient reporting that he can have several good days in a row now and states that he feels the frequency of his headaches has decreased some since starting therapy. Patient states that he has made a good amount of improvement in his balance and dizziness symptoms, but patient reports he continues to have fluctuating symptoms of migraine headaches. Patient reports 25% improvement overall in his symptoms.     PT Education - 08/22/17 1157    Education provided  Yes    Education Details  discussed final home exercise program and plans for discharge, issued active eye movement between two targets exercise for HEP    Person(s) Educated  Patient    Methods  Explanation;Handout    Comprehension  Verbalized understanding;Returned demonstration          PT Long Term Goals - 08/22/17 1158      PT LONG TERM GOAL #1   Title  Patient will be independent with HEP indepednently for self-management    Baseline  HEP continues to progress,     Time  4    Period  Weeks    Status  Achieved      PT LONG TERM GOAL #2   Title  Patient will demonstrate reduced falls risk as evidenced by Dynamic Gait Index (DGI) 21/24 or greater.    Baseline  23/24 DGI; 02/12/17: 24/24; scored 19/24 on 07/11/17; 11/20 scored 21/24    Time  8    Period  Weeks    Status  Achieved      PT LONG TERM GOAL #3   Title  Patient will be able to walk in tandem for 3f to demonstrate significant improvement in dynamic balance and improved standing balance.    Baseline  Patient steps out for support 7 times during test; 02/12/17: >20' without touching; demonstrated greater than 5 steps out for support on 07/11/17    Time  8    Period  Weeks    Status  Achieved      PT LONG TERM GOAL #4   Title  Patient will be able to perform SLS for >10 sec to  demonstrate significant improvement in static balance and ability to wash his LE's in the shower.     Baseline  8 secs B SLS; 02/12/17: 14.4s L/>30s R; 21 seconds L and R SLS on 07/11/2017    Time  8    Period  Weeks    Status  Partially Met      PT LONG TERM GOAL #5   Title  Patient will reduce perceived disability to low levels as indicated by <40 on Dizziness Handicap Inventory to demonstrate significant improvement in dizziness.    Baseline  DHI: 54; 02/12/17: 24/100,  6/13; 36/100 7/26: 40/100; scored 50/100 on 07/11/17    Time  8    Period  Weeks    Status  Partially Met            Plan - 08/22/17 1104    Clinical Impression Statement  Patient improved from 50/100 to 40/100 on the [facility] test which indicates moderate perception of handicap. Patient's ABC scale score did not statistically significantly change from last testing and was 37.5%. Patient scored 21/24 on the DGI. Patient has partially met 2/5 goals and met 3/5 goals as set on plan of care. Patient reporting that he can have "several good days in a row now" and states that he feels the frequency of his headaches have decreased some since starting therapy. Patient states that he has made a good amount of improvement in his balance and dizziness symptoms, but patient reports he continues to have fluctuating symptoms of migraine headaches. Patient reports 25% improvement overall in his symptoms. Patient reports that he has a follow-up appointment with Dr. Brigitte Pulse in December and an [facility] with [facility]. Discussed finalized home exercise program and plans for discharge from PT services and patient is in agreement to discharge from PT at this time. Patient plans on continuing to perform HEP upon discharge.     Rehab Potential  Fair    Clinical Impairments Affecting Rehab Potential  (+) highly motivated, family support (-) Symptoms evolving, >3 coormorbitites    PT Frequency  1x / week    PT Duration  8  weeks    PT Treatment/Interventions  Gait training;Stair training;Functional mobility training;Therapeutic activities;Balance training;Therapeutic exercise;Patient/family education;Neuromuscular re-education;Canalith Repostioning;Vestibular    PT Next Visit Plan  --    PT Home Exercise Plan  VOR x 1 in standing with feet apart, conflicting background 21J x 3, 3x/day, Semitandem balance with horizontal head turns 30s/each LE x 3 each, 3 times/day; ball toss to self horizontal and vertical 1 minute reps and body wall rolls 4 reps supported with EO, VOR X 2 and active eye movement between two targets with conflicting background in standing    Consulted and Agree with Plan of Care  Patient       Patient will benefit from skilled therapeutic intervention in order to improve the following deficits and impairments:  Decreased balance, Decreased activity tolerance, Decreased endurance, Decreased strength, Decreased coordination, Difficulty walking, Decreased mobility, Dizziness, Increased fascial restricitons  Visit Diagnosis: Dizziness and giddiness  Unsteadiness on feet     Problem List Patient Active Problem List   Diagnosis Date Noted  . Barrett esophagus 11/28/2016  . Celiac disease 11/28/2016  . GERD (gastroesophageal reflux disease) 11/28/2016  . Sleep apnea 11/28/2016  . History of aortic valve replacement 09/25/2016  . Palpitations 09/09/2016  . Encounter for management of temporary pacemaker 08/29/2016  . Metabolic acidosis 94/17/4081  . S/P aortic valve replacement with bioprosthetic valve 08/29/2016  . Leg length discrepancy 04/06/2015  . Tendinopathy of left gluteus medius 02/25/2015  . Left hip pain 12/22/2014  . Spondylosis of lumbar region without myelopathy or radiculopathy 12/22/2014  . COPD with emphysema ([place]) 12/09/2014  . Stenosis of lumbosacral spine 11/10/2014  . 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 AS (aortic  stenosis) 09/22/2013  . Bursitis of left hip 02/02/2013  . Atherosclerosis of native artery of extremity with intermittent claudication ([place]) 12/28/2012  . Facet arthritis of lumbar region ([place]) 12/01/2012  . DDD (degenerative disc  disease), lumbar 11/23/2012  . Low back pain 11/23/2012  . Dyslipidemia 04/09/2012  . Hypertension 04/09/2012  . Paroxysmal atrial fibrillation ([place]) 04/09/2012  . Pulmonary nodule 12/05/2011   Lady Deutscher PT, DPT [phone] Lady Deutscher 08/22/2017, 12:50 PM  [place] MAIN [facility] SERVICES [address] [place], [place], [address] Phone: [phone]   Fax:  [phone]  Name: KEYONTE COOKSTON MRN: [id number] Date of Birth: 1941/07/19

## 2018-12-04 ENCOUNTER — Emergency Department: Payer: Medicare Other

## 2018-12-04 ENCOUNTER — Other Ambulatory Visit: Payer: Self-pay

## 2018-12-04 ENCOUNTER — Emergency Department
Admission: EM | Admit: 2018-12-04 | Discharge: 2018-12-04 | Disposition: A | Discharge status: Home / Self Care | Payer: Medicare Other | Attending: Emergency Medicine | Admitting: Emergency Medicine

## 2018-12-04 DIAGNOSIS — Z87891 Personal history of nicotine dependence: Secondary | ICD-10-CM | POA: Diagnosis not present

## 2018-12-04 DIAGNOSIS — K5790 Diverticulosis of intestine, part unspecified, without perforation or abscess without bleeding: Secondary | ICD-10-CM | POA: Insufficient documentation

## 2018-12-04 DIAGNOSIS — N289 Disorder of kidney and ureter, unspecified: Secondary | ICD-10-CM | POA: Diagnosis not present

## 2018-12-04 DIAGNOSIS — I10 Essential (primary) hypertension: Secondary | ICD-10-CM | POA: Diagnosis not present

## 2018-12-04 DIAGNOSIS — Z7902 Long term (current) use of antithrombotics/antiplatelets: Secondary | ICD-10-CM | POA: Diagnosis not present

## 2018-12-04 DIAGNOSIS — Z79899 Other long term (current) drug therapy: Secondary | ICD-10-CM | POA: Insufficient documentation

## 2018-12-04 DIAGNOSIS — N189 Chronic kidney disease, unspecified: Secondary | ICD-10-CM

## 2018-12-04 DIAGNOSIS — Z7901 Long term (current) use of anticoagulants: Secondary | ICD-10-CM | POA: Diagnosis not present

## 2018-12-04 DIAGNOSIS — R799 Abnormal finding of blood chemistry, unspecified: Secondary | ICD-10-CM | POA: Diagnosis present

## 2018-12-04 DIAGNOSIS — J449 Chronic obstructive pulmonary disease, unspecified: Secondary | ICD-10-CM | POA: Diagnosis not present

## 2018-12-04 DIAGNOSIS — E860 Dehydration: Secondary | ICD-10-CM

## 2018-12-04 LAB — COMPREHENSIVE METABOLIC PANEL
ALT: 30 U/L (ref 0–44)
AST: 38 U/L (ref 15–41)
Albumin: 4.5 g/dL (ref 3.5–5.0)
Alkaline Phosphatase: 68 U/L (ref 38–126)
Anion gap: 5 (ref 5–15)
BUN: 52 mg/dL — ABNORMAL HIGH (ref 8–23)
CHLORIDE: 113 mmol/L — AB (ref 98–111)
CO2: 18 mmol/L — ABNORMAL LOW (ref 22–32)
Calcium: 8.7 mg/dL — ABNORMAL LOW (ref 8.9–10.3)
Creatinine, Ser: 2 mg/dL — ABNORMAL HIGH (ref 0.61–1.24)
GFR calc Af Amer: 36 mL/min — ABNORMAL LOW (ref >60)
GFR calc non Af Amer: 31 mL/min — ABNORMAL LOW (ref >60)
Glucose, Bld: 117 mg/dL — ABNORMAL HIGH (ref 70–99)
POTASSIUM: 5.5 mmol/L — AB (ref 3.5–5.1)
Sodium: 136 mmol/L (ref 135–145)
Total Bilirubin: 0.4 mg/dL (ref 0.3–1.2)
Total Protein: 7.5 g/dL (ref 6.5–8.1)

## 2018-12-04 LAB — CBC WITH DIFFERENTIAL/PLATELET
ABS IMMATURE GRANULOCYTES: 0.02 10*3/uL (ref 0.00–0.07)
Basophils Absolute: 0.1 10*3/uL (ref 0.0–0.1)
Basophils Relative: 1 %
Eosinophils Absolute: 0.3 10*3/uL (ref 0.0–0.5)
Eosinophils Relative: 4 %
HCT: 35.7 % — ABNORMAL LOW (ref 39.0–52.0)
Hemoglobin: 11.6 g/dL — ABNORMAL LOW (ref 13.0–17.0)
Immature Granulocytes: 0 %
Lymphocytes Relative: 22 %
Lymphs Abs: 1.5 10*3/uL (ref 0.7–4.0)
MCH: 29.3 pg (ref 26.0–34.0)
MCHC: 32.5 g/dL (ref 30.0–36.0)
MCV: 90.2 fL (ref 80.0–100.0)
Monocytes Absolute: 0.8 10*3/uL (ref 0.1–1.0)
Monocytes Relative: 11 %
NEUTROS ABS: 4.3 10*3/uL (ref 1.7–7.7)
NEUTROS PCT: 62 %
Platelets: 272 10*3/uL (ref 150–400)
RBC: 3.96 MIL/uL — ABNORMAL LOW (ref 4.22–5.81)
RDW: 13.3 % (ref 11.5–15.5)
WBC: 6.9 10*3/uL (ref 4.0–10.5)
nRBC: 0 % (ref 0.0–0.2)

## 2018-12-04 LAB — URINALYSIS, COMPLETE (UACMP) WITH MICROSCOPIC
Bacteria, UA: NONE SEEN
Bilirubin Urine: NEGATIVE
Glucose, UA: 50 mg/dL — AB
Hgb urine dipstick: NEGATIVE
Ketones, ur: NEGATIVE mg/dL
Leukocytes,Ua: NEGATIVE
NITRITE: NEGATIVE
Protein, ur: NEGATIVE mg/dL
Specific Gravity, Urine: 1.014 (ref 1.005–1.030)
Squamous Epithelial / HPF: NONE SEEN (ref 0–5)
WBC, UA: NONE SEEN WBC/hpf (ref 0–5)
pH: 5 (ref 5.0–8.0)

## 2018-12-04 MED ORDER — PATIROMER SORBITEX CALCIUM 8.4 G PO PACK
8.4000 g | PACK | Freq: Every day | ORAL | Status: DC
  Filled 2018-12-04 (×2): qty 1

## 2018-12-04 MED ORDER — SODIUM CHLORIDE 0.9 % IV BOLUS
1000.0000 mL | Freq: Once | INTRAVENOUS | Status: AC
  Administered 2018-12-04: 1000 mL via INTRAVENOUS

## 2018-12-04 NOTE — Discharge Instructions (Addendum)
Your CT scan and lab tests were okay today.  Your creatinine was 2.0 which is improved from your test in the primary care clinic.  We gave you IV fluids for hydration.  Be sure to increase your water intake at home to maintain good hydration.  Follow-up with your doctor in 3 or 4 days for recheck of your blood test to ensure that your kidney function is continuing to improve.  If there is no improvement you may need to follow-up with nephrology for further evaluation.  Results for orders placed or performed during the hospital encounter of 12/04/18  Comprehensive metabolic panel  Result Value Ref Range   Sodium 136 135 - 145 mmol/L   Potassium 5.5 (H) 3.5 - 5.1 mmol/L   Chloride 113 (H) 98 - 111 mmol/L   CO2 18 (L) 22 - 32 mmol/L   Glucose, Bld 117 (H) 70 - 99 mg/dL   BUN 52 (H) 8 - 23 mg/dL   Creatinine, Ser 1.32 (H) 0.61 - 1.24 mg/dL   Calcium 8.7 (L) 8.9 - 10.3 mg/dL   Total Protein 7.5 6.5 - 8.1 g/dL   Albumin 4.5 3.5 - 5.0 g/dL   AST 38 15 - 41 U/L   ALT 30 0 - 44 U/L   Alkaline Phosphatase 68 38 - 126 U/L   Total Bilirubin 0.4 0.3 - 1.2 mg/dL   GFR calc non Af Amer 31 (L) >60 mL/min   GFR calc Af Amer 36 (L) >60 mL/min   Anion gap 5 5 - 15  CBC with Differential  Result Value Ref Range   WBC 6.9 4.0 - 10.5 K/uL   RBC 3.96 (L) 4.22 - 5.81 MIL/uL   Hemoglobin 11.6 (L) 13.0 - 17.0 g/dL   HCT 44.0 (L) 10.2 - 72.5 %   MCV 90.2 80.0 - 100.0 fL   MCH 29.3 26.0 - 34.0 pg   MCHC 32.5 30.0 - 36.0 g/dL   RDW 36.6 44.0 - 34.7 %   Platelets 272 150 - 400 K/uL   nRBC 0.0 0.0 - 0.2 %   Neutrophils Relative % 62 %   Neutro Abs 4.3 1.7 - 7.7 K/uL   Lymphocytes Relative 22 %   Lymphs Abs 1.5 0.7 - 4.0 K/uL   Monocytes Relative 11 %   Monocytes Absolute 0.8 0.1 - 1.0 K/uL   Eosinophils Relative 4 %   Eosinophils Absolute 0.3 0.0 - 0.5 K/uL   Basophils Relative 1 %   Basophils Absolute 0.1 0.0 - 0.1 K/uL   Immature Granulocytes 0 %   Abs Immature Granulocytes 0.02 0.00 - 0.07 K/uL    Urinalysis, Complete w Microscopic  Result Value Ref Range   Color, Urine YELLOW (A) YELLOW   APPearance CLEAR (A) CLEAR   Specific Gravity, Urine 1.014 1.005 - 1.030   pH 5.0 5.0 - 8.0   Glucose, UA 50 (A) NEGATIVE mg/dL   Hgb urine dipstick NEGATIVE NEGATIVE   Bilirubin Urine NEGATIVE NEGATIVE   Ketones, ur NEGATIVE NEGATIVE mg/dL   Protein, ur NEGATIVE NEGATIVE mg/dL   Nitrite NEGATIVE NEGATIVE   Leukocytes,Ua NEGATIVE NEGATIVE   WBC, UA NONE SEEN 0 - 5 WBC/hpf   Bacteria, UA NONE SEEN NONE SEEN   Squamous Epithelial / LPF NONE SEEN 0 - 5   Ct Abdomen Pelvis Wo Contrast  Result Date: 12/04/2018 CLINICAL DATA:  Elevated creatinine.  History of celiac disease. EXAM: CT ABDOMEN AND PELVIS WITHOUT CONTRAST TECHNIQUE: Multidetector CT imaging of the abdomen and pelvis  was performed following the standard protocol without IV contrast. COMPARISON:  None. FINDINGS: LOWER CHEST: Lung bases are clear. The visualized heart size is normal. No pericardial effusion. HEPATOBILIARY: Gallbladder internal density without CT findings of acute cholecystitis. PANCREAS: Normal. SPLEEN: Normal. ADRENALS/URINARY TRACT: Kidneys are orthotopic, demonstrating normal size and morphology. No nephrolithiasis, hydronephrosis; limited assessment for renal masses by nonenhanced CT. 15 mm cyst RIGHT interpolar kidney. Punctate LEFT lower pole parenchymal calcification. The unopacified ureters are normal in course and caliber. Urinary bladder is adequately distended and unremarkable. Normal adrenal glands. STOMACH/BOWEL: The stomach, small and large bowel are normal in course and caliber without inflammatory changes, sensitivity decreased by lack of enteric contrast. Severe descending and sigmoid colonic diverticulosis. Normal appendix. VASCULAR/LYMPHATIC: Aortoiliac vessels are normal in course and caliber. Moderate calcific atherosclerosis. No lymphadenopathy by CT size criteria. REPRODUCTIVE: Normal. OTHER: No  intraperitoneal free fluid or free air. MUSCULOSKELETAL: Non-acute. Small bilateral fat containing inguinal hernias. Small LEFT hip periarticular heterotopic ossification associated with gluteus minimus atrophy. 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 IMPRESSION: 1. No nephrolithiasis or obstructive uropathy. 2. Diverticulosis without acute diverticulitis nor acute intra-abdominal/pelvic process. 3. Gallbladder density seen with vicarious excretion of contrast or sludge. Aortic Atherosclerosis (ICD10-I70.0). Electronically Signed   By: Awilda Metro M.D.   On: 12/04/2018 15:52     Return to the ED if you have severe weakness or fatigue, palpitations, chest pain, shortness of breath, dizziness, passing out, or other new concerns.

## 2018-12-04 NOTE — ED Provider Notes (Signed)
[facility] Emergency Department Provider Note  ____________________________________________  Time seen: Approximately 4:36 PM  I have reviewed the triage vital signs and the nursing notes.   HISTORY  Chief Complaint Abnormal Lab    HPI KEYTH KUPEC is a 78 y.o. male with a history of aortic aneurysm, atrial fibrillation on Eliquis, COPD GERD and hypertension who is sent to the ED today due to an abnormal lab test.  He was noted yesterday to have a creatinine of 2.7 at his primary care doctor's office.  This is compared to a baseline of 1.5.   He denies any acute symptoms.  No chest pain shortness of breath back pain or abdominal pain.  No vomiting or diarrhea.  No palpitations dizziness or syncope.  After being evaluated by the PA in the primary care clinic he was sent to the ED for further evaluation.  Patient denies dysuria or difficulty emptying his bladder.  Denies hesitancy or urgency.  Denies flank pain. Reviewed clinic notes.  Patient has reported that he does not drink any water when he drinks tea.  Past Medical History:  Diagnosis Date  . Aortic aneurysm (HCC)   . Aortic stenosis   . Arthritis    hips, legs  . Atrial fibrillation (HCC)   . Barrett's esophagus   . Celiac disease   . COPD (chronic obstructive pulmonary disease) (HCC)   . GERD (gastroesophageal reflux disease)   . Heart murmur   . History of hiatal hernia   . Hypertension   . PONV (postoperative nausea and vomiting)    nausea after cataract  . Sleep apnea    no CPAP  . Vertigo    no episodes - 2-3 yrs     Patient Active Problem List   Diagnosis Date Noted  . Barrett esophagus 11/28/2016  . Celiac disease 11/28/2016  . GERD (gastroesophageal reflux disease) 11/28/2016  . Sleep apnea 11/28/2016  . History of aortic valve replacement 09/25/2016  . Palpitations 09/09/2016  . Encounter for management of temporary pacemaker 08/29/2016  . Metabolic acidosis 08/29/2016  .  S/P aortic valve replacement with bioprosthetic valve 08/29/2016  . Leg length discrepancy 04/06/2015  . Tendinopathy of left gluteus medius 02/25/2015  . Left hip pain 12/22/2014  . Spondylosis of lumbar region without myelopathy or radiculopathy 12/22/2014  . COPD with emphysema (HCC) 12/09/2014  . Stenosis of lumbosacral spine 11/10/2014  . 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 AS (aortic stenosis) 09/22/2013  . Bursitis of left hip 02/02/2013  . Atherosclerosis of native artery of extremity with intermittent claudication (HCC) 12/28/2012  . Facet arthritis of lumbar region 12/01/2012  . DDD (degenerative disc disease), lumbar 11/23/2012  . Low back pain 11/23/2012  . Dyslipidemia 04/09/2012  . Hypertension 04/09/2012  . Paroxysmal atrial fibrillation (HCC) 04/09/2012  . Pulmonary nodule 12/05/2011     Past Surgical History:  Procedure Laterality Date  . CARDIAC CATHETERIZATION    . CARDIAC ELECTROPHYSIOLOGY STUDY AND ABLATION    . CARDIOVERSION    . CATARACT EXTRACTION W/PHACO Right 03/13/2016   Procedure: CATARACT EXTRACTION PHACO AND INTRAOCULAR LENS PLACEMENT (IOC);  Surgeon: Lockie Mola, MD;  Location: [facility];  Service: Ophthalmology;  Laterality: Right;  sleep apnea - no CPAP  . CATARACT EXTRACTION W/PHACO Left 04/10/2016   Procedure: CATARACT EXTRACTION PHACO AND INTRAOCULAR LENS PLACEMENT (IOC) left eye;  Surgeon: Lockie Mola, MD;  Location: [facility];  Service: Ophthalmology;  Laterality: Left;  .  COLONOSCOPY    . TUMOR EXCISION     hip bone     Prior to Admission medications   Medication Sig Start Date End Date Taking? Authorizing Provider  amLODipine (NORVASC) 5 MG tablet Take 5 mg by mouth daily.    [provider]  apixaban (ELIQUIS) 5 MG TABS tablet Take 5 mg by mouth 2 (two) times daily.    [provider]  ARTIFICIAL TEAR OP Apply to eye.    [provider]  aspirin 325 MG tablet Take 325 mg by mouth daily.    [provider]  butalbital-acetaminophen-caffeine (FIORICET WITH CODEINE) 50-325-40-30 MG capsule Take 1 capsule by mouth every 4 (four) hours as needed for headache.    [provider]  Cholecalciferol (VITAMIN D) 2000 units CAPS Take by mouth daily.    [provider]  clopidogrel (PLAVIX) 75 MG tablet Take 75 mg by mouth daily.    [provider]  Coenzyme Q10 (COQ10) 200 MG CAPS Take by mouth daily.    [provider]  dicyclomine (BENTYL) 10 MG capsule Take 10 mg by mouth daily.    [provider]  gabapentin (NEURONTIN) 100 MG capsule Take 100 mg by mouth 3 (three) times daily.    [provider]  metoprolol succinate (TOPROL-XL) 25 MG 24 hr tablet Take 75 mg by mouth daily.    [provider]  metoprolol tartrate (LOPRESSOR) 25 MG tablet Take 12.5 mg by mouth daily.    [provider]  Multiple Vitamins-Minerals (ICAPS AREDS 2 PO) Take 1 tablet by mouth daily.    [provider]  olmesartan (BENICAR) 20 MG tablet Take 20 mg by mouth daily.    [provider]  pantoprazole (PROTONIX) 40 MG tablet Take 40 mg by mouth daily.    [provider]  potassium gluconate 595 (99 K) MG TABS tablet Take 595 mg by mouth daily.    [provider]  psyllium (METAMUCIL) 58.6 % powder Take 1 packet by mouth daily.    [provider]     Allergies Oxycodone; Procardia [nifedipine]; Statins; and Tricor [fenofibrate]   History reviewed. No pertinent family history.  Social History Social History   Tobacco Use  . Smoking status: Former Smoker    Last attempt to quit: 09/23/1996    Years since quitting: 22.2  . Smokeless tobacco: Never Used  Substance Use Topics  . Alcohol use: No  . Drug use: Not on file    Review of Systems  Constitutional:   No fever or chills.  ENT:   No sore throat. No  rhinorrhea. Cardiovascular:   No chest pain or syncope. Respiratory:   No dyspnea or cough. Gastrointestinal:   Negative for abdominal pain, vomiting and diarrhea.  Musculoskeletal:   Negative for focal pain or swelling All other systems reviewed and are negative except as documented above in ROS and HPI.  ____________________________________________   PHYSICAL EXAM:  VITAL SIGNS: ED Triage Vitals  Enc Vitals Group     BP 12/04/18 1127 (!) 123/53     Pulse Rate 12/04/18 1127 63     Resp 12/04/18 1127 16     Temp 12/04/18 1127 98 F (36.7 C)     Temp Source 12/04/18 1127 Oral     SpO2 12/04/18 1127 97 %     Weight 12/04/18 1127 200 lb (90.7 kg)     Height 12/04/18 1127 6' (1.829 m)     Head Circumference --  Peak Flow --      Pain Score 12/04/18 1142 3     Pain Loc --      Pain Edu? --      Excl. in GC? --     Vital signs reviewed, nursing assessments reviewed.   Constitutional:   Alert and oriented. Non-toxic appearance. Eyes:   Conjunctivae are normal. EOMI. PERRL. ENT      Head:   Normocephalic and atraumatic.      Nose:   No congestion/rhinnorhea.       Mouth/Throat:   MMM, no pharyngeal erythema. No peritonsillar mass.       Neck:   No meningismus. Full ROM. Hematological/Lymphatic/Immunilogical:   No cervical lymphadenopathy. Cardiovascular:   RRR. Symmetric bilateral radial and DP pulses.  No murmurs. Cap refill less than 2 seconds. Respiratory:   Normal respiratory effort without tachypnea/retractions. Breath sounds are clear and equal bilaterally. No wheezes/rales/rhonchi. Gastrointestinal:   Soft with mild generalized tenderness, nonfocal. Non distended. There is no CVA tenderness.  No rebound, rigidity, or guarding. Musculoskeletal:   Normal range of motion in all extremities. No joint effusions.  No lower extremity tenderness.  No edema. Neurologic:   Normal speech and language.  Motor grossly intact. No acute focal neurologic deficits are appreciated.   Skin:    Skin is warm, dry and intact. No rash noted.  No petechiae, purpura, or bullae.  ____________________________________________    LABS (pertinent positives/negatives) (all labs ordered are listed, but only abnormal results are displayed) Labs Reviewed  COMPREHENSIVE METABOLIC PANEL - Abnormal; Notable for the following components:      Result Value   Potassium 5.5 (*)    Chloride 113 (*)    CO2 18 (*)    Glucose, Bld 117 (*)    BUN 52 (*)    Creatinine, Ser 2.00 (*)    Calcium 8.7 (*)    GFR calc non Af Amer 31 (*)    GFR calc Af Amer 36 (*)    All other components within normal limits  CBC WITH DIFFERENTIAL/PLATELET - Abnormal; Notable for the following components:   RBC 3.96 (*)    Hemoglobin 11.6 (*)    HCT 35.7 (*)    All other components within normal limits  URINALYSIS, COMPLETE (UACMP) WITH MICROSCOPIC - Abnormal; Notable for the following components:   Color, Urine YELLOW (*)    APPearance CLEAR (*)    Glucose, UA 50 (*)    All other components within normal limits   ____________________________________________   EKG    ____________________________________________    RADIOLOGY  Ct Abdomen Pelvis Wo Contrast  Result Date: 12/04/2018 CLINICAL DATA:  Elevated creatinine.  History of celiac disease. EXAM: CT ABDOMEN AND PELVIS WITHOUT CONTRAST TECHNIQUE: Multidetector CT imaging of the abdomen and pelvis was performed following the standard protocol without IV contrast. COMPARISON:  None. FINDINGS: LOWER CHEST: Lung bases are clear. The visualized heart size is normal. No pericardial effusion. HEPATOBILIARY: Gallbladder internal density without CT findings of acute cholecystitis. PANCREAS: Normal. SPLEEN: Normal. ADRENALS/URINARY TRACT: Kidneys are orthotopic, demonstrating normal size and morphology. No nephrolithiasis, hydronephrosis; limited assessment for renal masses by nonenhanced CT. 15 mm cyst RIGHT interpolar kidney. Punctate LEFT lower pole  parenchymal calcification. The unopacified ureters are normal in course and caliber. Urinary bladder is adequately distended and unremarkable. Normal adrenal glands. STOMACH/BOWEL: The stomach, small and large bowel are normal in course and caliber without inflammatory changes, sensitivity decreased by lack of enteric contrast. Severe descending and sigmoid  colonic diverticulosis. Normal appendix. VASCULAR/LYMPHATIC: Aortoiliac vessels are normal in course and caliber. Moderate calcific atherosclerosis. No lymphadenopathy by CT size criteria. REPRODUCTIVE: Normal. OTHER: No intraperitoneal free fluid or free air. MUSCULOSKELETAL: Non-acute. Small bilateral fat containing inguinal hernias. Small LEFT hip periarticular heterotopic ossification associated with gluteus minimus atrophy. 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 IMPRESSION: 1. No nephrolithiasis or obstructive uropathy. 2. Diverticulosis without acute diverticulitis nor acute intra-abdominal/pelvic process. 3. Gallbladder density seen with vicarious excretion of contrast or sludge. Aortic Atherosclerosis (ICD10-I70.0). Electronically Signed   By: Awilda Metro M.D.   On: 12/04/2018 15:52    ____________________________________________   PROCEDURES Procedures  ____________________________________________  DIFFERENTIAL DIAGNOSIS   Ureterolithiasis, prostatic hypertrophy, bladder outlet obstruction, cystitis, dehydration, diverticulitis  CLINICAL IMPRESSION / ASSESSMENT AND PLAN / ED COURSE  Medications ordered in the ED: Medications  patiromer Lelon Perla) packet 8.4 g (has no administration in time range)  sodium chloride 0.9 % bolus 1,000 mL (0 mLs Intravenous Stopped 12/04/18 1448)    Pertinent labs & imaging results that were available during my care of the patient were reviewed by me and considered in my medical decision making (see chart for details).    Patient presents with isolated finding of acute on chronic renal  insufficiency as identified by primary care clinic.  On exam he does have some generalized abdominal tenderness which is nonfocal and overall not peritoneal or overly worrisome.  However with his advanced age, comorbidities, plan for CT scan.  Repeat labs today are improved, creatinine 2.0.  IV saline bolus of 1 L for hydration.  Potassium is 5.5, patient given Veltassa, and I think the IV fluids will allow for greater renal filtration and improved potassium control.  Clinical Course as of Dec 04 1635  Fri Dec 04, 2018  1620 CT unremarkable.  No evidence of ureteral obstruction.  Bladder is not pathologically distended, volume less than 300 on CT.  Stable for discharge home, counseled to follow-up with Dr. in 3 days to repeat lab work, referral to nephrology if creatinine is not improving with focused hydration.   [PS]    Clinical Course User Index [PS] Sharman Cheek, MD     ____________________________________________   FINAL CLINICAL IMPRESSION(S) / ED DIAGNOSES    Final diagnoses:  Dehydration  Acute on chronic renal insufficiency  Diverticulosis     ED Discharge Orders    None      Portions of this note were generated with dragon dictation software. Dictation errors may occur despite best attempts at proofreading.   Sharman Cheek, MD 12/04/18 [phone]

## 2018-12-04 NOTE — ED Notes (Signed)
Patient requested this RN call his wife to update her on plan of care/CT scan order. RN unable to reach wife at this time.

## 2018-12-04 NOTE — ED Triage Notes (Signed)
Pt sent from MD for abnormal kidney labs. 2/1 creatitine was 1.52 and GFR was 43. 3/12 creatitine 2.7 and CGR 22. No distress noted. Pt A&O, ambulatory.

## 2019-07-02 ENCOUNTER — Other Ambulatory Visit: Payer: Self-pay

## 2019-07-02 DIAGNOSIS — Z20822 Contact with and (suspected) exposure to covid-19: Secondary | ICD-10-CM

## 2019-07-04 LAB — NOVEL CORONAVIRUS, NAA: SARS-CoV-2, NAA: NOT DETECTED

## 2019-09-15 ENCOUNTER — Ambulatory Visit: Payer: Medicare Other | Attending: Internal Medicine

## 2019-09-15 DIAGNOSIS — Z20822 Contact with and (suspected) exposure to covid-19: Secondary | ICD-10-CM

## 2019-09-17 LAB — NOVEL CORONAVIRUS, NAA: SARS-CoV-2, NAA: DETECTED — AB

## 2019-09-18 ENCOUNTER — Ambulatory Visit: Payer: Self-pay

## 2019-09-18 NOTE — Telephone Encounter (Signed)
Pt given Covid-19 positive results. Discussed mild, moderate and severe symptoms. Advised pt to call 911 for any respiratory issues and/dehydration. Discussed non test criteria for ending self isolation. Pt advised of way to manage symptoms at home and review isolation precautions especially the importance of washing hands frequently and wearing a mask when around others. Pt verbalized understanding. Will report to HD.  Reason for Disposition . General information question, no triage required and triager able to answer question  Answer Assessment - Initial Assessment Questions 1. REASON FOR CALL or QUESTION: "What is your reason for calling today?" or "How can I best help you?" or "What question do you have that I can help answer?"     Covid results.  Protocols used: INFORMATION ONLY CALL-A-AH

## 2019-12-17 ENCOUNTER — Ambulatory Visit: Payer: Medicare Other | Attending: Internal Medicine

## 2019-12-17 DIAGNOSIS — Z23 Encounter for immunization: Secondary | ICD-10-CM

## 2019-12-17 NOTE — Progress Notes (Signed)
   Covid-19 Vaccination Clinic  Name:  Jeremy Mason    MRN: [id number] DOB: Oct 09, 1940  12/17/2019  Jeremy Mason was observed post Covid-19 immunization for 15 minutes without incident. He was provided with Vaccine Information Sheet and instruction to access the V-Safe system.   Jeremy Mason was instructed to call 911 with any severe reactions post vaccine: Marland Kitchen Difficulty breathing  . Swelling of face and throat  . A fast heartbeat  . A bad rash all over body  . Dizziness and weakness   Immunizations Administered    Name Date Dose VIS Date Route   Pfizer COVID-19 Vaccine 12/17/2019 10:34 AM 0.3 mL 09/03/2019 Intramuscular   Manufacturer: [employer], [employer]   Lot: [id number]   NDC: [address]-8

## 2020-01-10 ENCOUNTER — Ambulatory Visit: Payer: Medicare Other | Attending: Internal Medicine

## 2020-01-10 DIAGNOSIS — Z23 Encounter for immunization: Secondary | ICD-10-CM

## 2020-01-10 NOTE — Progress Notes (Signed)
   Covid-19 Vaccination Clinic  Name:  Jeremy Mason    MRN: [id number] DOB: [birth year]  01/10/2020  [name] was observed post Covid-19 immunization for 15 minutes without incident. He was provided with Vaccine Information Sheet and instruction to access the V-Safe system.   [name] was instructed to call 911 with any severe reactions post vaccine: Marland Kitchen Difficulty breathing  . Swelling of face and throat  . A fast heartbeat  . A bad rash all over body  . Dizziness and weakness   Immunizations Administered    Name Date Dose VIS Date Route   Pfizer COVID-19 Vaccine 01/10/2020  2:20 PM 0.3 mL 11/17/2018 Intramuscular   Manufacturer: [employer], [employer]   Lot: [id number]   NDC: [address]-2

## 2022-02-16 ENCOUNTER — Observation Stay
Admission: EM | Admit: 2022-02-16 | Discharge: 2022-02-17 | Disposition: A | Discharge status: Home / Self Care | Payer: Medicare Other | Attending: Student in an Organized Health Care Education/Training Program | Admitting: Student in an Organized Health Care Education/Training Program

## 2022-02-16 ENCOUNTER — Emergency Department: Payer: Medicare Other

## 2022-02-16 ENCOUNTER — Other Ambulatory Visit: Payer: Self-pay

## 2022-02-16 DIAGNOSIS — R197 Diarrhea, unspecified: Secondary | ICD-10-CM | POA: Insufficient documentation

## 2022-02-16 DIAGNOSIS — R195 Other fecal abnormalities: Secondary | ICD-10-CM | POA: Diagnosis not present

## 2022-02-16 DIAGNOSIS — Z7982 Long term (current) use of aspirin: Secondary | ICD-10-CM | POA: Diagnosis not present

## 2022-02-16 DIAGNOSIS — Z87891 Personal history of nicotine dependence: Secondary | ICD-10-CM | POA: Diagnosis not present

## 2022-02-16 DIAGNOSIS — K2270 Barrett's esophagus without dysplasia: Secondary | ICD-10-CM | POA: Insufficient documentation

## 2022-02-16 DIAGNOSIS — I350 Nonrheumatic aortic (valve) stenosis: Secondary | ICD-10-CM | POA: Diagnosis not present

## 2022-02-16 DIAGNOSIS — K219 Gastro-esophageal reflux disease without esophagitis: Secondary | ICD-10-CM | POA: Insufficient documentation

## 2022-02-16 DIAGNOSIS — I480 Paroxysmal atrial fibrillation: Secondary | ICD-10-CM | POA: Insufficient documentation

## 2022-02-16 DIAGNOSIS — E785 Hyperlipidemia, unspecified: Secondary | ICD-10-CM | POA: Diagnosis not present

## 2022-02-16 DIAGNOSIS — N189 Chronic kidney disease, unspecified: Secondary | ICD-10-CM | POA: Insufficient documentation

## 2022-02-16 DIAGNOSIS — N179 Acute kidney failure, unspecified: Secondary | ICD-10-CM | POA: Diagnosis not present

## 2022-02-16 DIAGNOSIS — K900 Celiac disease: Secondary | ICD-10-CM | POA: Diagnosis not present

## 2022-02-16 DIAGNOSIS — I2510 Atherosclerotic heart disease of native coronary artery without angina pectoris: Secondary | ICD-10-CM | POA: Diagnosis not present

## 2022-02-16 DIAGNOSIS — I491 Atrial premature depolarization: Secondary | ICD-10-CM | POA: Insufficient documentation

## 2022-02-16 DIAGNOSIS — Z954 Presence of other heart-valve replacement: Secondary | ICD-10-CM | POA: Diagnosis not present

## 2022-02-16 DIAGNOSIS — E875 Hyperkalemia: Secondary | ICD-10-CM | POA: Diagnosis present

## 2022-02-16 DIAGNOSIS — Z7901 Long term (current) use of anticoagulants: Secondary | ICD-10-CM | POA: Insufficient documentation

## 2022-02-16 DIAGNOSIS — R109 Unspecified abdominal pain: Secondary | ICD-10-CM | POA: Insufficient documentation

## 2022-02-16 DIAGNOSIS — Z7902 Long term (current) use of antithrombotics/antiplatelets: Secondary | ICD-10-CM | POA: Diagnosis not present

## 2022-02-16 DIAGNOSIS — Z79899 Other long term (current) drug therapy: Secondary | ICD-10-CM | POA: Diagnosis not present

## 2022-02-16 DIAGNOSIS — K5730 Diverticulosis of large intestine without perforation or abscess without bleeding: Secondary | ICD-10-CM | POA: Insufficient documentation

## 2022-02-16 DIAGNOSIS — I129 Hypertensive chronic kidney disease with stage 1 through stage 4 chronic kidney disease, or unspecified chronic kidney disease: Secondary | ICD-10-CM | POA: Diagnosis not present

## 2022-02-16 DIAGNOSIS — J449 Chronic obstructive pulmonary disease, unspecified: Secondary | ICD-10-CM | POA: Diagnosis not present

## 2022-02-16 DIAGNOSIS — K8020 Calculus of gallbladder without cholecystitis without obstruction: Secondary | ICD-10-CM | POA: Insufficient documentation

## 2022-02-16 DIAGNOSIS — I700 Atherosclerosis of aorta: Secondary | ICD-10-CM | POA: Insufficient documentation

## 2022-02-16 DIAGNOSIS — Z8719 Personal history of other diseases of the digestive system: Secondary | ICD-10-CM | POA: Diagnosis not present

## 2022-02-16 LAB — URINALYSIS, COMPLETE (UACMP) WITH MICROSCOPIC
Bacteria, UA: NONE SEEN
Bilirubin Urine: NEGATIVE
Glucose, UA: NEGATIVE mg/dL
Hgb urine dipstick: NEGATIVE
Ketones, ur: NEGATIVE mg/dL
Leukocytes,Ua: NEGATIVE
Nitrite: NEGATIVE
Protein, ur: NEGATIVE mg/dL
Specific Gravity, Urine: 1.01 (ref 1.005–1.030)
Squamous Epithelial / HPF: NONE SEEN (ref 0–5)
WBC, UA: NONE SEEN WBC/hpf (ref 0–5)
pH: 5 (ref 5.0–8.0)

## 2022-02-16 LAB — URINALYSIS, ROUTINE W REFLEX MICROSCOPIC
Bilirubin Urine: NEGATIVE
Glucose, UA: NEGATIVE mg/dL
Hgb urine dipstick: NEGATIVE
Ketones, ur: NEGATIVE mg/dL
Leukocytes,Ua: NEGATIVE
Nitrite: NEGATIVE
Protein, ur: NEGATIVE mg/dL
Specific Gravity, Urine: 1.01 (ref 1.005–1.030)
pH: 5 (ref 5.0–8.0)

## 2022-02-16 LAB — COMPREHENSIVE METABOLIC PANEL
ALT: 18 U/L (ref 0–44)
AST: 35 U/L (ref 15–41)
Albumin: 4.1 g/dL (ref 3.5–5.0)
Alkaline Phosphatase: 97 U/L (ref 38–126)
Anion gap: 7 (ref 5–15)
BUN: 37 mg/dL — ABNORMAL HIGH (ref 8–23)
CO2: 23 mmol/L (ref 22–32)
Calcium: 9 mg/dL (ref 8.9–10.3)
Chloride: 105 mmol/L (ref 98–111)
Creatinine, Ser: 1.69 mg/dL — ABNORMAL HIGH (ref 0.61–1.24)
GFR, Estimated: 40 mL/min — ABNORMAL LOW (ref >60)
Glucose, Bld: 85 mg/dL (ref 70–99)
Potassium: 5.6 mmol/L — ABNORMAL HIGH (ref 3.5–5.1)
Sodium: 135 mmol/L (ref 135–145)
Total Bilirubin: 1 mg/dL (ref 0.3–1.2)
Total Protein: 7.6 g/dL (ref 6.5–8.1)

## 2022-02-16 LAB — CBC
HCT: 34.5 % — ABNORMAL LOW (ref 39.0–52.0)
Hemoglobin: 11 g/dL — ABNORMAL LOW (ref 13.0–17.0)
MCH: 30 pg (ref 26.0–34.0)
MCHC: 31.9 g/dL (ref 30.0–36.0)
MCV: 94 fL (ref 80.0–100.0)
Platelets: 367 10*3/uL (ref 150–400)
RBC: 3.67 MIL/uL — ABNORMAL LOW (ref 4.22–5.81)
RDW: 12.5 % (ref 11.5–15.5)
WBC: 7.4 10*3/uL (ref 4.0–10.5)
nRBC: 0 % (ref 0.0–0.2)

## 2022-02-16 LAB — PROTEIN / CREATININE RATIO, URINE
Creatinine, Urine: 63 mg/dL
Total Protein, Urine: <6 mg/dL

## 2022-02-16 LAB — LIPASE, BLOOD: Lipase: 41 U/L (ref 11–51)

## 2022-02-16 LAB — CK: Total CK: 200 U/L (ref 49–397)

## 2022-02-16 MED ORDER — ACETAMINOPHEN 325 MG PO TABS: 650.0000 mg | ORAL_TABLET | Freq: Four times a day (QID) | ORAL | Status: DC | PRN

## 2022-02-16 MED ORDER — APIXABAN 2.5 MG PO TABS
2.5000 mg | ORAL_TABLET | Freq: Two times a day (BID) | ORAL | Status: DC
  Administered 2022-02-16 – 2022-02-17 (×2): 2.5 mg via ORAL
  Filled 2022-02-16 (×2): qty 1

## 2022-02-16 MED ORDER — NAPHAZOLINE-PHENIRAMINE 0.025-0.3 % OP SOLN: 1.0000 [drp] | Freq: Four times a day (QID) | OPHTHALMIC | Status: DC | PRN

## 2022-02-16 MED ORDER — PROCHLORPERAZINE MALEATE 10 MG PO TABS: 10.0000 mg | ORAL_TABLET | Freq: Two times a day (BID) | ORAL | Status: DC | PRN

## 2022-02-16 MED ORDER — POLYETHYLENE GLYCOL 3350 17 G PO PACK: 17.0000 g | PACK | Freq: Every day | ORAL | Status: DC

## 2022-02-16 MED ORDER — IRBESARTAN 150 MG PO TABS: 150.0000 mg | ORAL_TABLET | Freq: Every day | ORAL | Status: DC

## 2022-02-16 MED ORDER — SUCRALFATE 1 GM/10ML PO SUSP: 1.0000 g | Freq: Every day | ORAL | Status: DC | PRN

## 2022-02-16 MED ORDER — SODIUM CHLORIDE 0.9 % IV BOLUS
500.0000 mL | Freq: Once | INTRAVENOUS | Status: AC
  Administered 2022-02-16: 500 mL via INTRAVENOUS

## 2022-02-16 MED ORDER — SODIUM CHLORIDE 0.9 % IV SOLN: INTRAVENOUS | Status: DC

## 2022-02-16 MED ORDER — SODIUM ZIRCONIUM CYCLOSILICATE 10 G PO PACK
10.0000 g | PACK | ORAL | Status: AC
  Administered 2022-02-16: 10 g via ORAL
  Filled 2022-02-16 (×2): qty 1

## 2022-02-16 MED ORDER — ENOXAPARIN SODIUM 40 MG/0.4ML IJ SOSY: 40.0000 mg | PREFILLED_SYRINGE | INTRAMUSCULAR | Status: DC

## 2022-02-16 MED ORDER — ACETAMINOPHEN 650 MG RE SUPP: 650.0000 mg | Freq: Four times a day (QID) | RECTAL | Status: DC | PRN

## 2022-02-16 MED ORDER — BISACODYL 5 MG PO TBEC: 5.0000 mg | DELAYED_RELEASE_TABLET | Freq: Every day | ORAL | Status: DC | PRN

## 2022-02-16 MED ORDER — AMLODIPINE BESYLATE 5 MG PO TABS
5.0000 mg | ORAL_TABLET | Freq: Every day | ORAL | Status: DC
  Administered 2022-02-16 – 2022-02-17 (×2): 5 mg via ORAL
  Filled 2022-02-16 (×2): qty 1

## 2022-02-16 MED ORDER — METOPROLOL SUCCINATE ER 25 MG PO TB24
25.0000 mg | ORAL_TABLET | Freq: Every day | ORAL | Status: DC
  Administered 2022-02-16 – 2022-02-17 (×2): 25 mg via ORAL
  Filled 2022-02-16 (×2): qty 1

## 2022-02-16 NOTE — ED Notes (Signed)
This RN was called to transport pt to 205. RN advised room was ready and green. RN got to floor and 205 was dirty. Charge nurse on floor and tech noticed we was here. Pt was relocated to room 202.

## 2022-02-16 NOTE — ED Triage Notes (Signed)
Patient to ER via POV from [facility] clinic. Reports that he was referred to the ER due to an elevated potassium level, 5.9 today and worsening kidney function at 2.3. Patient reports taking x3 doses of kayexalate overnight. Reports decreased urine output.

## 2022-02-16 NOTE — H&P (Signed)
History and Physical    Jeremy Mason IOM:[id number] DOB: 1940-10-15 DOA: 02/16/2022 PCP: Jerl Mina, MD  Chief Complaint: AKI Historian: patient HPI:  Jeremy Mason is a 81 y.o. male with a PMH significant for aortic stenosis s/p replacement on eliquis, CAD, barrett esophagus, celiac disease, COPD, DDD, HLD, GERD, HTN, PAF. They presented from home to the ED on 02/16/2022 with AKI and hyperkalemia as found at his PCP appointment x several days. He was given kayexalate outpatient yesterday but only mild improvement in his K+.  He states that he has been having abdominal cramping and diarrhea for about a month. He also had intermittent dry heaving but denies any nausea currently. Denies hematochezia, melena or hematemesis. He had about 5 episodes of loose stool today. States he's been urinating frequently and small volumes. He was treated outpatient for diverticulitis with bactrim and flagyl and completed that treatment without improvement.  Denies any significant NSAID use. Denies any significant diet changes recently. Denies sensation of palpations or heart flutter, chest pain, SOB.  In the ED, it was found that they had mild bradycardia to 55, hypertension to 150/63 and otherwise stable vital signs. Significant findings included: Na+ 136, K+ 5.9, BUN 40, Cr 2.3, GFR 27. WBC 7.4, hgb 11. Repeat metabolic panel showed K+ 5.6, BUN 37, Cr 1.69. Gastrointestinal panel pending.  CT renal stone negative for calculus or hydronephrosis, positive cholelithiasis without evidence of acute cholecystitis, sigmoid diverticulosis without evidence of acute diverticulitis.  Nephrology was consulted from ED.   They were initially treated with lokelma.  Patient was admitted to medicine service for further workup and management of AKI and hyperkalemia as outlined in detail below.  Review of Systems: As per HPI otherwise 10 point review of systems negative.   Past Medical History:  Diagnosis Date    Aortic aneurysm (HCC)    Aortic stenosis    Arthritis    hips, legs   Atrial fibrillation (HCC)    Barrett's esophagus    Celiac disease    COPD (chronic obstructive pulmonary disease) (HCC)    GERD (gastroesophageal reflux disease)    Heart murmur    History of hiatal hernia    Hypertension    PONV (postoperative nausea and vomiting)    nausea after cataract   Sleep apnea    no CPAP   Vertigo    no episodes - 2-3 yrs    Past Surgical History:  Procedure Laterality Date   CARDIAC CATHETERIZATION     CARDIAC ELECTROPHYSIOLOGY STUDY AND ABLATION     CARDIOVERSION     CATARACT EXTRACTION W/PHACO Right 03/13/2016   Procedure: CATARACT EXTRACTION PHACO AND INTRAOCULAR LENS PLACEMENT (IOC);  Surgeon: Lockie Mola, MD;  [facility];  Service: Ophthalmology;  Laterality: Right;  sleep apnea - no CPAP   CATARACT EXTRACTION W/PHACO Left 04/10/2016   Procedure: CATARACT EXTRACTION PHACO AND INTRAOCULAR LENS PLACEMENT (IOC) left eye;  Surgeon: Lockie Mola, MD;  Location: [facility];  Service: Ophthalmology;  Laterality: Left;   COLONOSCOPY     TUMOR EXCISION     hip bone     reports that he quit smoking about 25 years ago. He has never used smokeless tobacco. He reports that he does not drink alcohol. No history on file for drug use.  Allergies  Allergen Reactions   Oxycodone Nausea And Vomiting   Procardia [Nifedipine]    Statins Other (See Comments)    Muscle pain    Tricor [Fenofibrate]  No family history on file.  Prior to Admission medications   Medication Sig Start Date End Date Taking? Authorizing Provider  amLODipine (NORVASC) 5 MG tablet Take 5 mg by mouth daily.    [provider]  apixaban (ELIQUIS) 5 MG TABS tablet Take 5 mg by mouth 2 (two) times daily.    [provider]  ARTIFICIAL TEAR OP Apply to eye.    [provider]  aspirin 325 MG tablet Take 325 mg by mouth daily.    [provider]  butalbital-acetaminophen-caffeine (FIORICET WITH CODEINE) 50-325-40-30 MG capsule Take 1 capsule by mouth every 4 (four) hours as needed for headache.    [provider]  Cholecalciferol (VITAMIN D) 2000 units CAPS Take by mouth daily.    [provider]  clopidogrel (PLAVIX) 75 MG tablet Take 75 mg by mouth daily.    [provider]  Coenzyme Q10 (COQ10) 200 MG CAPS Take by mouth daily.    [provider]  dicyclomine (BENTYL) 10 MG capsule Take 10 mg by mouth daily.    [provider]  gabapentin (NEURONTIN) 100 MG capsule Take 100 mg by mouth 3 (three) times daily.    [provider]  metoprolol succinate (TOPROL-XL) 25 MG 24 hr tablet Take 75 mg by mouth daily.    [provider]  metoprolol tartrate (LOPRESSOR) 25 MG tablet Take 12.5 mg by mouth daily.    [provider]  Multiple Vitamins-Minerals (ICAPS AREDS 2 PO) Take 1 tablet by mouth daily.    [provider]  olmesartan (BENICAR) 20 MG tablet Take 20 mg by mouth daily.    [provider]  pantoprazole (PROTONIX) 40 MG tablet Take 40 mg by mouth daily.    [provider]  potassium gluconate 595 (99 K) MG TABS tablet Take 595 mg by mouth daily.    [provider]  psyllium (METAMUCIL) 58.6 % powder Take 1 packet by mouth daily.    [provider]   I have personally, briefly reviewed patient's prior medical records in [facility]  Physical Exam: Vitals:   02/16/22 1311 02/16/22 1608 02/16/22 1744  BP: (!) 147/80 (!) 150/63 (!) 165/61  Pulse: (!) 55 (!) 57 64  Resp: Temp: 98 F (36.7 C)  98.4 F (36.9 C)  TempSrc:   Oral  SpO2: 100% 100% 100%  Weight: 88.2 kg  88.2 kg  Height: 6' (1.829 m)  6' (1.829 m)    Constitutional: NAD, calm, comfortable HEENT: lids and conjunctivae normal Mucous membranes are moist. Posterior pharynx clear of any exudate or lesions.Normal dentition.   Neck: normal, supple, no masses, no thyromegaly Respiratory: CTAB, no wheezing, no crackles. Normal respiratory effort. No accessory muscle use.  Cardiovascular: RRR, No extremity edema. 2+ pedal pulses. no clubbing / cyanosis.  Abdomen: soft, tenderness along central abdomen with involuntary guarding Musculoskeletal: No joint deformity upper and lower extremities. Normal muscle tone.  Skin: dry, intact, normal color, normal temperature Neurologic: Alert and oriented x 3. Normal speech.  Grossly non-focal exam. PERRL Psychiatric: Normal mood. Congruent affect.  Normal judgement and insight.  Labs on Admission: I have personally reviewed following labs and imaging studies  CBC: Recent Labs  Lab 02/16/22 1405  WBC 7.4  HGB 11.0*  HCT 34.5*  MCV 94.0  PLT 367   Urine analysis:    Component Value Date/Time   COLORURINE STRAW (A) 02/16/2022 1519   COLORURINE STRAW (A) 02/16/2022 1519  APPEARANCEUR CLEAR (A) 02/16/2022 1519   APPEARANCEUR CLEAR (A) 02/16/2022 1519   LABSPEC 1.010 02/16/2022 1519   LABSPEC 1.010 02/16/2022 1519   PHURINE 5.0 02/16/2022 1519   PHURINE 5.0 02/16/2022 1519   GLUCOSEU NEGATIVE 02/16/2022 1519   GLUCOSEU NEGATIVE 02/16/2022 1519   HGBUR NEGATIVE 02/16/2022 1519   HGBUR NEGATIVE 02/16/2022 1519   BILIRUBINUR NEGATIVE 02/16/2022 1519   BILIRUBINUR NEGATIVE 02/16/2022 1519   KETONESUR NEGATIVE 02/16/2022 1519   KETONESUR NEGATIVE 02/16/2022 1519   PROTEINUR NEGATIVE 02/16/2022 1519   PROTEINUR NEGATIVE 02/16/2022 1519   NITRITE NEGATIVE 02/16/2022 1519   NITRITE NEGATIVE 02/16/2022 1519   LEUKOCYTESUR NEGATIVE 02/16/2022 1519   LEUKOCYTESUR NEGATIVE 02/16/2022 1519    Radiological Exams on Admission: CT Renal Stone Study  Result Date: 02/16/2022 CLINICAL DATA:  Flank pain, kidney stones suspected, rule out obstructive uropathy EXAM: CT ABDOMEN AND PELVIS WITHOUT CONTRAST TECHNIQUE: Multidetector CT imaging of the abdomen and pelvis was  performed following the standard protocol without IV contrast. RADIATION DOSE REDUCTION: This exam was performed according to the departmental dose-optimization program which includes automated exposure control, adjustment of the mA and/or kV according to patient size and/or use of iterative reconstruction technique. COMPARISON:  12/04/2018 FINDINGS: Lower chest: No acute abnormality. Hepatobiliary: No solid liver abnormality is seen. Large gallstones in the gallbladder. No gallbladder wall thickening, or biliary dilatation. Pancreas: Unremarkable. No pancreatic ductal dilatation or surrounding inflammatory changes. Spleen: Normal in size without significant abnormality. Adrenals/Urinary Tract: Adrenal glands are unremarkable. Kidneys are normal, without renal calculi, solid lesion, or hydronephrosis. Bladder is unremarkable. Stomach/Bowel: Stomach is within normal limits. Diverticulum of the descending duodenum. Appendix appears normal. No evidence of bowel wall thickening, distention, or inflammatory changes. Sigmoid diverticula. Vascular/Lymphatic: Aortic atherosclerosis. No enlarged abdominal or pelvic lymph nodes. Reproductive: No mass or other significant abnormality. Other: No abdominal wall hernia or abnormality. No ascites. Musculoskeletal: No acute or significant osseous findings. IMPRESSION: 1. No urinary tract calculus or hydronephrosis. 2. Cholelithiasis without evidence of acute cholecystitis. 3. 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 Aortic Atherosclerosis (ICD10-I70.0). Electronically Signed   By: Jearld Lesch M.D.   On: 02/16/2022 15:50    EKG: Independently reviewed. NSR. Maybe borderline t wave peaked but no change from previous  Assessment/Plan Principal Problem:   Hyperkalemia   AKI- Cr normal 06/2021 but looks like a lot of variability and elevated since then. 1.69 today. Suspect from hypovolemia in setting of month long GI symptoms. BUN Cr ratio 22. No signs  of distal obstruction. Euvolemic on exam. Renal stone CT negative hydronephrosis - nephrology consulted from ED, appreciate recs - hold home losartan - BMP am - IVF hydration - avoid nephrotoxic agents - consider renal US  Hyperkalemia- no calcium given on presentation with normal ECG. Lokelma x1 and improving - repeat BMP am - lokelma, insulin, albuterol PRN  aortic stenosis s/p replacement with biosynthetic valve  PAF on eliquis  HLD  HLD-  - eliquis reduced to 2.5mg  BID due to age, and Crcl - continue home antihypertensives except ARB  COPD- ORA - albuterol PRN  DVT prophylaxis: apixaban (ELIQUIS) tablet 2.5 mg Start: 02/16/22 2200 apixaban (ELIQUIS) tablet 2.5 mg   Code Status: full  Family Communication: wife at bedside  Disposition Plan: med surg  Consults called: nephrology   Leeroy Bock, DO [facility]  02/16/2022, 7:37 PM    To contact the appropriate [facility] Attending or Consulting provider: Check the care team in [facility] for a) attending/consulting [facility] provider  name and b) the [facility] team listed Log into [email or web address] and use [place]'s universal password to access. If you do not have the password, please contact the hospital operator. Locate the [facility] provider you are looking for under [facility] and page to a number that you can be directly reached. (Text pages appreciated) If you still have difficulty reaching the provider, please page the [facility] (Director on Call) for the Hospitalists listed on amion for assistance.

## 2022-02-16 NOTE — ED Provider Notes (Signed)
[facility] Provider Note    Event Date/Time   First MD Initiated Contact with Patient 02/16/22 1501     (approximate)   History   Kidney problem  HPI  Jeremy Mason is a 81 y.o. male who on review of note from internal medicine clinic today was referred to the ER for concerns of hyperkalemia and worsening renal function  Noted labs from clinic today with potassium of 5.9 and creatinine of 2.3  Patient was recently treated with Flagyl and Bactrim  He had a recent abdominal pain and severe diarrhea, seen at [facility] then treated with antibiotic, his doctors been evaluating him with lab testing is noticed his kidney function and potassium have worsened  He went to clinic today, had labs checked and was referred to the ER  Reports that he is not having severe pain or anything like he was about a week ago when they started on antibiotic and those symptoms of abdominal pain and fever have gone away, but he does report that he has been taking medicine for elevated potassium 3 times.  In addition he is now started having loose watery stool again today   Decreased urine output.  Reports he has been hydrating well over the last couple days but was severely dehydrated about a week ago.  No difficulty starting or stopping urination but is urinating less  Physical Exam   Triage Vital Signs: ED Triage Vitals [02/16/22 1311]  Enc Vitals Group     BP (!) 147/80     Pulse Rate (!) 55     Resp 18     Temp 98 F (36.7 C)     Temp src      SpO2 100 %     Weight 194 lb 6.4 oz (88.2 kg)     Height 6' (1.829 m)     Head Circumference      Peak Flow      Pain Score 3     Pain Loc      Pain Edu?      Excl. in [place]?     Most recent vital signs: Vitals:   02/16/22 1311 02/16/22 1608  BP: (!) 147/80 (!) 150/63  Pulse: (!) 55 (!) 57  Resp: 18 16  Temp: 98 F (36.7 C)   SpO2: 100% 100%     General: Awake, no distress.  CV:  Good peripheral perfusion.   Resp:  Normal effort.  Abd:  No distention.  Soft nontender nondistended all quadrant Other:     ED Results / Procedures / Treatments   Labs (all labs ordered are listed, but only abnormal results are displayed) Labs Reviewed  CBC - Abnormal; Notable for the following components:      Result Value   RBC 3.67 (*)    Hemoglobin 11.0 (*)    HCT 34.5 (*)    All other components within normal limits  GASTROINTESTINAL PANEL BY PCR, STOOL (REPLACES STOOL CULTURE)  C DIFFICILE QUICK SCREEN W PCR REFLEX    COMPREHENSIVE METABOLIC PANEL  URINALYSIS, ROUTINE W REFLEX MICROSCOPIC  CK  PROTEIN / CREATININE RATIO, URINE  LIPASE, BLOOD     EKG  ED ECG REPORT I, Delman Kitten, the attending physician, personally viewed and interpreted this ECG.  Date: 02/16/2022 EKG Time: 1315 Rate: 55 Rhythm: normal sinus rhythm QRS Axis: normal Intervals: normal ST/T Wave abnormalities: normal Narrative Interpretation: no evidence of acute ischemia    RADIOLOGY  Personally interpreted the  patient's CT imaging for acute gross pathology I do not see evidence of acute obstructive abnormality  CT Renal Stone Study  Result Date: 02/16/2022 CLINICAL DATA:  Flank pain, kidney stones suspected, rule out obstructive uropathy EXAM: CT ABDOMEN AND PELVIS WITHOUT CONTRAST TECHNIQUE: Multidetector CT imaging of the abdomen and pelvis was performed following the standard protocol without IV contrast. RADIATION DOSE REDUCTION: This exam was performed according to the departmental dose-optimization program which includes automated exposure control, adjustment of the mA and/or kV according to patient size and/or use of iterative reconstruction technique. COMPARISON:  12/04/2018 FINDINGS: Lower chest: No acute abnormality. Hepatobiliary: No solid liver abnormality is seen. Large gallstones in the gallbladder. No gallbladder wall thickening, or biliary dilatation. Pancreas: Unremarkable. No pancreatic ductal dilatation  or surrounding inflammatory changes. Spleen: Normal in size without significant abnormality. Adrenals/Urinary Tract: Adrenal glands are unremarkable. Kidneys are normal, without renal calculi, solid lesion, or hydronephrosis. Bladder is unremarkable. Stomach/Bowel: Stomach is within normal limits. Diverticulum of the descending duodenum. Appendix appears normal. No evidence of bowel wall thickening, distention, or inflammatory changes. Sigmoid diverticula. Vascular/Lymphatic: Aortic atherosclerosis. No enlarged abdominal or pelvic lymph nodes. Reproductive: No mass or other significant abnormality. Other: No abdominal wall hernia or abnormality. No ascites. Musculoskeletal: No acute or significant osseous findings. IMPRESSION: 1. No urinary tract calculus or hydronephrosis. 2. Cholelithiasis without evidence of acute cholecystitis. 3. 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 Aortic Atherosclerosis (ICD10-I70.0). Electronically Signed   By: Delanna Ahmadi M.D.   On: 02/16/2022 15:50      PROCEDURES:  Critical Care performed: No  Procedures   MEDICATIONS ORDERED IN ED: Medications  sodium zirconium cyclosilicate (LOKELMA) packet 10 g (has no administration in time range)  sodium chloride 0.9 % bolus 500 mL (500 mLs Intravenous New Bag/Given 02/16/22 1606)     IMPRESSION / MDM / ASSESSMENT AND PLAN / ED COURSE  I reviewed the triage vital signs and the nursing notes.                              Differential diagnosis includes, but is not limited to, prerenal, renal, postrenal etiologies.  Dehydration, medication effect, interstitial nephritis ATN and rhabdo, etc.  Patient's abdominal pain is largely resolved he has started experiencing loose stools once again possibly secondary to use of Kayexalate, but also wish to rule out infectious etiologies.  Stool studies ordered.  He appears euvolemic at this time, but given his labs will also give small fluid bolus.  Review of labs  from clinic indicates notable hyperkalemia without EKG changes, as well as worsening renal function over his baseline indicative of AKI.  At this juncture, discussed with Dr. Candiss Norse of nephrology.  Have ordered labs at his request to associate with further testing as to cause for renal abnormalities.  In addition CT renal study to evaluate for possible obstructive uropathy.  Patient's presentation is most consistent with acute presentation with potential threat to life or bodily function.  Concern for hyperkalemia, also will give dose of Lokelma as directed by nephrology.  Nephrology will provide consultation  Labs today interpreted by me and reviewed from the [facility] system indicating AKI with elevated creatinine and hyperkalemia  ----------------------------------------- 4:21 PM on 02/16/2022 ----------------------------------------- Admission discussed with Dr. Ouida Sills of the hospitalist service, patient accepted in admission after discussion and consult.  Labs from [place] clinic seems sufficient at this point to make admission decision.  Recheck of potassium, creatinine pending as  well as additional testing such as CK and urinalysis.  Stool studies  The patient is on the cardiac monitor to evaluate for evidence of arrhythmia and/or significant heart rate changes.    Labs here demonstrate slightly improved creatinine and calcium levels from lab draw earlier at [place].  However, based on the patient's presentation persistent AKI and ongoing hyper kalemia I do think it would be prudent to admit for further care and treatment and nephrology consult.  FINAL CLINICAL IMPRESSION(S) / ED DIAGNOSES   Final diagnoses:  AKI (acute kidney injury) ([place])  Hyperkalemia  Calculus of gallbladder without cholecystitis without obstruction  Loose stools     Rx / DC Orders   ED Discharge Orders     None        Note:  This document was prepared using Dragon voice recognition software and  may include unintentional dictation errors.   Delman Kitten, MD 02/16/22 1714

## 2022-02-17 DIAGNOSIS — K8020 Calculus of gallbladder without cholecystitis without obstruction: Secondary | ICD-10-CM | POA: Diagnosis not present

## 2022-02-17 DIAGNOSIS — R195 Other fecal abnormalities: Secondary | ICD-10-CM

## 2022-02-17 DIAGNOSIS — N179 Acute kidney failure, unspecified: Secondary | ICD-10-CM | POA: Diagnosis not present

## 2022-02-17 DIAGNOSIS — E875 Hyperkalemia: Secondary | ICD-10-CM | POA: Diagnosis not present

## 2022-02-17 LAB — BASIC METABOLIC PANEL
Anion gap: 5 (ref 5–15)
Anion gap: 6 (ref 5–15)
BUN: 29 mg/dL — ABNORMAL HIGH (ref 8–23)
BUN: 27 mg/dL — ABNORMAL HIGH (ref 8–23)
CO2: 22 mmol/L (ref 22–32)
CO2: 24 mmol/L (ref 22–32)
Calcium: 8.5 mg/dL — ABNORMAL LOW (ref 8.9–10.3)
Calcium: 8.9 mg/dL (ref 8.9–10.3)
Chloride: 109 mmol/L (ref 98–111)
Chloride: 107 mmol/L (ref 98–111)
Creatinine, Ser: 1.27 mg/dL — ABNORMAL HIGH (ref 0.61–1.24)
Creatinine, Ser: 1.39 mg/dL — ABNORMAL HIGH (ref 0.61–1.24)
GFR, Estimated: 57 mL/min — ABNORMAL LOW (ref >60)
GFR, Estimated: 51 mL/min — ABNORMAL LOW (ref >60)
Glucose, Bld: 90 mg/dL (ref 70–99)
Glucose, Bld: 114 mg/dL — ABNORMAL HIGH (ref 70–99)
Potassium: 5 mmol/L (ref 3.5–5.1)
Potassium: 4.6 mmol/L (ref 3.5–5.1)
Sodium: 136 mmol/L (ref 135–145)
Sodium: 137 mmol/L (ref 135–145)

## 2022-02-17 LAB — GASTROINTESTINAL PANEL BY PCR, STOOL (REPLACES STOOL CULTURE)

## 2022-02-17 LAB — C DIFFICILE QUICK SCREEN W PCR REFLEX
C Diff antigen: NEGATIVE
C Diff interpretation: NOT DETECTED
C Diff toxin: NEGATIVE

## 2022-02-17 MED ORDER — APIXABAN 2.5 MG PO TABS: 2.5000 mg | ORAL_TABLET | Freq: Two times a day (BID) | ORAL | 0 refills | Status: AC

## 2022-02-17 MED ORDER — ACETAMINOPHEN 325 MG PO TABS: 650.0000 mg | ORAL_TABLET | Freq: Four times a day (QID) | ORAL | Status: AC | PRN

## 2022-02-17 MED ORDER — APIXABAN 5 MG PO TABS: 5.0000 mg | ORAL_TABLET | Freq: Two times a day (BID) | ORAL | Status: DC

## 2022-02-17 NOTE — Consult Note (Signed)
[facility]  CONSULT NOTE    Date: 02/17/2022                  Patient Name:  Jeremy Mason  MRN: [id number]  DOB: 07/03/1941  Age / Sex: 81 y.o., male         PCP: Jeremy MinaHedrick, James, MD                 Service Requesting Consult: Jeremy Mason                 Reason for Consult: Acute kidney injury with hyperkalemia            History of Present Illness: [name] is a 81 y.o.  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  He states he was sent from [facility] due to abnormal labs and elevated potassium levels.  Patient states a few days ago he believes he ate undercooked chicken wings.  He began having abdominal pain, presented to primary care physician and was given Carafate.  He states this improved the abdominal pain however the diarrhea began.  He is unable to recall how long the diarrhea persisted.  He states he had taken 2 bottles of Pepto-Bismol over the course of 2 days.  Unable to tolerate meals.  He states he has chronic kidney disease at baseline, GFR 45.  He understands he is to avoid NSAIDs, dark sodas.  He states he drinks clear sodas and water with Crystal light packets.  Denies chest pain.  Denies known fever or chills.  Labs on ED arrival show potassium 5.6 with BUN 37 and creatinine 1.69.  UA appears normal.  CT renal stone negative for obstruction, cholecystitis, and diverticulitis.   Medications: Outpatient medications: Medications Prior to Admission  Medication Sig Dispense Refill Last Dose   amLODipine (NORVASC) 5 MG tablet Take 5 mg by mouth daily.   02/15/2022    apixaban (ELIQUIS) 5 MG TABS tablet Take 5 mg by mouth 2 (two) times daily.   02/16/2022 at 0600   ARTIFICIAL TEAR OP Apply to eye.   Past Week   Cholecalciferol (VITAMIN D) 2000 units CAPS Take by mouth daily.   02/15/2022   Coenzyme Q10 (COQ10) 200 MG CAPS Take by mouth daily.   02/16/2022   gemfibrozil (LOPID) 600 MG tablet Take 600 mg by mouth 2 (two) times daily.   02/16/2022   metoprolol succinate (TOPROL-XL) 25 MG 24 hr tablet Take 1 tablet by mouth daily.   02/16/2022   Multiple Vitamins-Minerals (ICAPS AREDS 2 PO) Take 1 tablet by mouth daily.   02/15/2022   olmesartan (BENICAR) 20 MG tablet Take 20 mg by mouth daily.   02/16/2022   pantoprazole (PROTONIX) 40 MG tablet Take 40 mg by mouth daily.   02/16/2022   prochlorperazine (COMPAZINE) 10 MG tablet Take 10 mg by mouth 2 (two) times daily as needed.   Past Week   sucralfate (CARAFATE) 1 GM/10ML suspension SMARTSIG:Milliliter(s) By Mouth   Past Week   aspirin 325 MG tablet Take 325 mg by mouth daily. (Patient not taking: Reported on 02/16/2022)   Not Taking   butalbital-acetaminophen-caffeine (FIORICET WITH CODEINE) 50-325-40-30 MG capsule Take  1 capsule by mouth every 4 (four) hours as needed for headache. (Patient not taking: Reported on 02/16/2022)   Not Taking   clopidogrel (PLAVIX) 75 MG tablet Take 75 mg by mouth daily. (Patient not taking: Reported on 02/16/2022)   Not Taking   cyanocobalamin (,VITAMIN B-12,) 1000 MCG/ML injection Inject 1,000 mcg into the muscle every 30 (thirty) days.   01/22/2022   dicyclomine (BENTYL) 10 MG capsule Take 10 mg by mouth daily. (Patient not taking: Reported on 02/16/2022)   Not Taking   diltiazem (CARDIZEM CD) 120 MG 24 hr capsule Take 120 mg by mouth daily.      gabapentin (NEURONTIN) 100 MG capsule Take 100 mg by mouth 3 (three) times daily. (Patient not taking: Reported on 02/16/2022)   Not Taking   metoprolol succinate (TOPROL-XL) 100 MG 24 hr tablet Take by mouth. (Patient not taking: Reported on 02/16/2022)    Not Taking   metoprolol succinate (TOPROL-XL) 25 MG 24 hr tablet Take 75 mg by mouth daily. (Patient not taking: Reported on 02/16/2022)   Not Taking   metoprolol tartrate (LOPRESSOR) 25 MG tablet Take 12.5 mg by mouth daily. (Patient not taking: Reported on 02/16/2022)   Not Taking   potassium gluconate 595 (99 K) MG TABS tablet Take 595 mg by mouth daily. (Patient not taking: Reported on 02/16/2022)   Not Taking   psyllium (METAMUCIL) 58.6 % powder Take 1 packet by mouth daily. (Patient not taking: Reported on 02/16/2022)   Not Taking   sulfamethoxazole-trimethoprim (BACTRIM DS) 800-160 MG tablet Take 1 tablet by mouth 2 (two) times daily. (Patient not taking: Reported on 02/16/2022)   Not Taking    Current medications: Current Facility-Administered Medications  Medication Dose Route Frequency Provider Last Rate Last Admin   0.9 %  sodium chloride infusion   Intravenous Continuous Jeremy Bock, MD 100 mL/hr at 02/17/22 0414 New Bag at 02/17/22 0414   acetaminophen (TYLENOL) tablet 650 mg  650 mg Oral Q6H PRN Jeremy Bock, MD       Or   acetaminophen (TYLENOL) suppository 650 mg  650 mg Rectal Q6H PRN Jeremy Bock, MD       amLODipine (NORVASC) tablet 5 mg  5 mg Oral Daily Jeremy Bock, MD   5 mg at 02/17/22 0913   apixaban (ELIQUIS) tablet 2.5 mg  2.5 mg Oral BID Jeremy Bock, MD   2.5 mg at 02/17/22 0913   bisacodyl (DULCOLAX) EC tablet 5 mg  5 mg Oral Daily PRN Jeremy Bock, MD       metoprolol succinate (TOPROL-XL) 24 hr tablet 25 mg  25 mg Oral Daily Jeremy Bock, MD   25 mg at 02/17/22 0913   naphazoline-pheniramine (NAPHCON-A) 0.025-0.3 % ophthalmic solution 1 drop  1 drop Both Eyes QID PRN Jeremy Mason       polyethylene glycol (MIRALAX / GLYCOLAX) packet 17 g  17 g Oral Daily Jeremy Bock, MD       prochlorperazine (COMPAZINE) tablet 10 mg  10 mg Oral BID PRN Jeremy Bock, MD       sucralfate (CARAFATE) 1 GM/10ML  suspension 1 g  1 g Oral Daily PRN Jeremy Bock, MD          Allergies: Allergies  Allergen Reactions   Oxycodone Nausea And Vomiting   Procardia [Nifedipine]    Statins Other (See Comments)    Muscle pain    Tricor [Fenofibrate]  Past Medical History: Past Medical History:  Diagnosis Date   Aortic aneurysm (HCC)    Aortic stenosis    Arthritis    hips, legs   Atrial fibrillation (HCC)    Barrett's esophagus    Celiac disease    COPD (chronic obstructive pulmonary disease) (HCC)    GERD (gastroesophageal reflux disease)    Heart murmur    History of hiatal hernia    Hypertension    PONV (postoperative nausea and vomiting)    nausea after cataract   Sleep apnea    no CPAP   Vertigo    no episodes - 2-3 yrs     Past Surgical History: Past Surgical History:  Procedure Laterality Date   CARDIAC CATHETERIZATION     CARDIAC ELECTROPHYSIOLOGY STUDY AND ABLATION     CARDIOVERSION     CATARACT EXTRACTION W/PHACO Right 03/13/2016   Procedure: CATARACT EXTRACTION PHACO AND INTRAOCULAR LENS PLACEMENT (IOC);  Surgeon: Lockie Mola, MD;  Location: [facility];  Service: Ophthalmology;  Laterality: Right;  sleep apnea - no CPAP   CATARACT EXTRACTION W/PHACO Left 04/10/2016   Procedure: CATARACT EXTRACTION PHACO AND INTRAOCULAR LENS PLACEMENT (IOC) left eye;  Surgeon: Lockie Mola, MD;  Location: [facility];  Service: Ophthalmology;  Laterality: Left;   COLONOSCOPY     TUMOR EXCISION     hip bone     Family History: No family history on file.   Social History: Social History   Socioeconomic History   Marital status: Married    Spouse name: Not on file   Number of children: Not on file   Years of education: Not on file   Highest education level: Not on file  Occupational History   Not on file  Tobacco Use   Smoking status: Former    Types: Cigarettes    Quit date: 09/23/1996    Years since quitting: 25.4   Smokeless  tobacco: Never  Substance and Sexual Activity   Alcohol use: No   Drug use: Not on file   Sexual activity: Not on file  Other Topics Concern   Not on file  Social History Narrative   Not on file   Social Determinants of Health   Financial Resource Strain: Not on file  Food Insecurity: Not on file  Transportation Needs: Not on file  Physical Activity: Not on file  Stress: Not on file  Social Connections: Not on file  Intimate Partner Violence: Not on file     Review of Systems: Review of Systems  Constitutional:  Negative for chills, fever and malaise/fatigue.  HENT:  Negative for congestion, sore throat and tinnitus.   Eyes:  Negative for blurred vision and redness.  Respiratory:  Negative for cough, shortness of breath and wheezing.   Cardiovascular:  Negative for chest pain, palpitations, claudication and leg swelling.  Gastrointestinal:  Positive for abdominal pain, diarrhea and nausea. Negative for blood in stool and vomiting.  Genitourinary:  Negative for flank pain, frequency and hematuria.  Musculoskeletal:  Negative for back pain, falls and myalgias.  Skin:  Negative for rash.  Neurological:  Negative for dizziness, weakness and headaches.  Endo/Heme/Allergies:  Does not bruise/bleed easily.  Psychiatric/Behavioral:  Negative for depression. The patient is not nervous/anxious and does not have insomnia.    Vital Signs: Blood pressure (!) 161/63, pulse (!) 58, temperature 97.7 F (36.5 C), temperature source Oral, resp. rate 18, height 6' (1.829 m), weight 88.2 kg, SpO2 97 %.  Weight trends: [employer]  02/16/22 1311 02/16/22 1744  Weight: 88.2 kg 88.2 kg    Physical Exam: General: NAD, resting in bed  Head: Normocephalic, atraumatic. Moist oral mucosal membranes  Eyes: Anicteric  Lungs:  Clear to auscultation, normal effort, room air  Heart: Regular rate and rhythm  Abdomen:  Soft, nontender, nondistended  Extremities: No peripheral edema.   Neurologic: Nonfocal, moving all four extremities  Skin: No lesions  Access: None     Lab results: Basic Metabolic Panel: Recent Labs  Lab 02/16/22 1405 02/17/22 0510  NA 135 136  K 5.6* 5.0  CL 105 109  CO2 23 22  GLUCOSE 85 90  BUN 37* 29*  CREATININE 1.69* 1.27*  CALCIUM 9.0 8.5*    Liver Function Tests: Recent Labs  Lab 02/16/22 1405  AST 35  ALT 18  ALKPHOS 97  BILITOT 1.0  PROT 7.6  ALBUMIN 4.1   Recent Labs  Lab 02/16/22 1520  LIPASE 41   No results for input(s): AMMONIA in the last 168 hours.  CBC: Recent Labs  Lab 02/16/22 1405  WBC 7.4  HGB 11.0*  HCT 34.5*  MCV 94.0  PLT 367    Cardiac Enzymes: Recent Labs  Lab 02/16/22 1520  CKTOTAL 200    BNP: Invalid input(s): POCBNP  CBG: No results for input(s): GLUCAP in the last 168 hours.  Microbiology: Results for orders placed or performed in visit on 09/15/19  Novel Coronavirus, NAA ([facility])     Status: Abnormal   Collection Time: 09/15/19  2:37 PM   Specimen: Nasopharyngeal(NP) swabs in vial transport medium   NASOPHARYNGE  TESTING  Result Value Ref Range Status   SARS-CoV-2, NAA Detected (A) Not Detected Final    Comment: This nucleic acid amplification test was developed and its performance characteristics determined by [employer]. Nucleic acid amplification tests include PCR and TMA. This test has not been FDA cleared or approved. This test has been authorized by FDA under an Emergency Use Authorization (EUA). This test is only authorized for the duration of time the declaration that circumstances exist justifying the authorization of the emergency use of in vitro diagnostic tests for detection of SARS-CoV-2 virus and/or diagnosis of COVID-19 infection under section 564(b)(1) of the Act, 21 U.S.C. 885OYD-7(A) (1), unless the authorization is terminated or revoked sooner. When diagnostic testing is negative, the possibility of a false negative result should be  considered in the context of a patient's recent exposures and the presence of clinical signs and symptoms consistent with COVID-19. An individual without symptoms of COVID-19 and who is not shedding SARS-CoV-2 virus would  expect to have a negative (not detected) result in this assay.     Coagulation Studies: No results for input(s): LABPROT, INR in the last 72 hours.  Urinalysis: Recent Labs    02/16/22 1519  COLORURINE STRAW*  STRAW*  LABSPEC 1.010  1.010  PHURINE 5.0  5.0  GLUCOSEU NEGATIVE  NEGATIVE  HGBUR NEGATIVE  NEGATIVE  BILIRUBINUR NEGATIVE  NEGATIVE  KETONESUR NEGATIVE  NEGATIVE  PROTEINUR NEGATIVE  NEGATIVE  NITRITE NEGATIVE  NEGATIVE  LEUKOCYTESUR NEGATIVE  NEGATIVE      Imaging: CT Renal Stone Study  Result Date: 02/16/2022 CLINICAL DATA:  Flank pain, kidney stones suspected, rule out obstructive uropathy EXAM: CT ABDOMEN AND PELVIS WITHOUT CONTRAST TECHNIQUE: Multidetector CT imaging of the abdomen and pelvis was performed following the standard protocol without IV contrast. RADIATION DOSE REDUCTION: This exam was performed according to the departmental dose-optimization program which includes automated exposure  control, adjustment of the mA and/or kV according to patient size and/or use of iterative reconstruction technique. COMPARISON:  12/04/2018 FINDINGS: Lower chest: No acute abnormality. Hepatobiliary: No solid liver abnormality is seen. Large gallstones in the gallbladder. No gallbladder wall thickening, or biliary dilatation. Pancreas: Unremarkable. No pancreatic ductal dilatation or surrounding inflammatory changes. Spleen: Normal in size without significant abnormality. Adrenals/Urinary Tract: Adrenal glands are unremarkable. Kidneys are normal, without renal calculi, solid lesion, or hydronephrosis. Bladder is unremarkable. Stomach/Bowel: Stomach is within normal limits. Diverticulum of the descending duodenum. Appendix appears normal. No evidence of  bowel wall thickening, distention, or inflammatory changes. Sigmoid diverticula. Vascular/Lymphatic: Aortic atherosclerosis. No enlarged abdominal or pelvic lymph nodes. Reproductive: No mass or other significant abnormality. Other: No abdominal wall hernia or abnormality. No ascites. Musculoskeletal: No acute or significant osseous findings. IMPRESSION: 1. No urinary tract calculus or hydronephrosis. 2. Cholelithiasis without evidence of acute cholecystitis. 3. 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 Aortic Atherosclerosis (ICD10-I70.0). Electronically Signed   By: Jearld Lesch M.D.   On: 02/16/2022 15:50     Assessment & Plan: Jeremy Mason is a 81 y.o.  male with past medical conditions including COPD, celiac disease, CAD, Barrett's esophagus, GERD, hypertension, aortic stenosis with replacement on Eliquis, and hyperlipidemia, who was admitted to [facility] on 02/16/2022 for Hyperkalemia [E87.5] Loose stools [R19.5] Calculus of gallbladder without cholecystitis without obstruction [K80.20] AKI (acute kidney injury) (HCC) [N17.9]   Hyperkalemia with acute kidney injury likely on chronic kidney disease stage IIIa due to hypovolemia caused by prolonged diarrhea.  Baseline creatinine appears to be 1.46 with GFR 48 on 02/01/2022.  On losartan currently held in setting of kidney injury.  No IV contrast exposure.  CT renal stone negative for obstruction.  No acute indication for dialysis at this time.  Adequate urine output recorded overnight.  Patient given multiple doses of Kayexalate prior to admission to manage elevated potassium.  Patient also prescribed Lokelma in ED.  Potassium 5.0 this morning.  Renal function has improved greater than stated baseline.  Agree with IV fluids.  We will consider stopping soon due to patient's ability to tolerate meals at this time.  Patient very inquisitive about help and asked appropriate questions to prevent current health concerns in the future.   Patient will be provided education on high potassium foods and encouraged to limit crystallite packets as some of those contain potassium.  Patient encouraged to follow-up with primary at discharge.  States he has appointment later this week.  2.  Hypertension with chronic kidney disease.  Home regimen includes amlodipine, metoprolol, olmesartan, and diltiazem.  Currently receiving amlodipine and metoprolol only.  Blood pressure elevated, 161/63.     LOS: 0 Ondre Salvetti 5/28/202312:06 PM

## 2022-02-17 NOTE — Discharge Summary (Signed)
Physician Discharge Summary  Patient: Jeremy Mason XMI:[id number] DOB: 1940/11/09   Code Status: Full Code Admit date: 02/16/2022 Discharge date: 02/17/2022 Disposition: Home, No home health services recommended PCP: Jerl Mina, MD  Recommendations for Outpatient Follow-up:  Follow up with PCP within 1 week Metabolic panel Consider restarting olmesartan if needed Eliquis was reduced for age/kidney function adjustment GI panel pending at time of discharge but low suspicion as patient's symptoms resolved spontaneously prior to dc   Discharge Diagnoses:  Principal Problem:   Hyperkalemia  Brief Hospital Course Summary: Jeremy Mason is a 81 y.o. male with a PMH significant for aortic stenosis s/p replacement on eliquis, CAD, barrett esophagus, celiac disease, COPD, DDD, HLD, GERD, HTN, PAF. They presented from home to the ED on 02/16/2022 with AKI and hyperkalemia as found at his PCP appointment x several days. He was given kayexalate outpatient yesterday but only mild improvement in his K+.   He states that he has been having abdominal cramping and diarrhea for about a month. He also had intermittent dry heaving but denies any nausea currently. Denies hematochezia, melena or hematemesis. He had about 5 episodes of loose stool today. States he's been urinating frequently and small volumes. He was treated outpatient for diverticulitis with bactrim and flagyl and completed that treatment without improvement.  Denies any significant NSAID use. Denies any significant diet changes recently. Denies sensation of palpations or heart flutter, chest pain, SOB.   In the ED, it was found that they had mild bradycardia to 55, hypertension to 150/63 and otherwise stable vital signs. Significant findings included: Na+ 136, K+ 5.9, BUN 40, Cr 2.3, GFR 27. WBC 7.4, hgb 11. Repeat metabolic panel showed K+ 5.6, BUN 37, Cr 1.69. Gastrointestinal panel pending.  CT renal stone negative for  calculus or hydronephrosis, positive cholelithiasis without evidence of acute cholecystitis, sigmoid diverticulosis without evidence of acute diverticulitis.   Nephrology was consulted from ED.   They were initially treated with lokelma.  K+ normalized x2 prior to dc and kidney function returned to normal with IVF hydration. Had normal formed stool prior to dc.   Other chronic medications adjusted for kidney function as listed below.   Discharge Condition: Good, improved Recommended discharge diet: Regular healthy diet  Consultations: Nephrology   Procedures/Studies: CT renal stone- negative   Allergies as of 02/17/2022       Reactions   Oxycodone Nausea And Vomiting   Procardia [nifedipine]    Statins Other (See Comments)   Muscle pain   Tricor [fenofibrate]         Medication List     STOP taking these medications    aspirin 325 MG tablet   butalbital-apap-caffeine-codeine 50-325-40-30 MG capsule Commonly known as: FIORICET WITH CODEINE   clopidogrel 75 MG tablet Commonly known as: PLAVIX   cyanocobalamin 1000 MCG/ML injection Commonly known as: (VITAMIN B-12)   dicyclomine 10 MG capsule Commonly known as: BENTYL   diltiazem 120 MG 24 hr capsule Commonly known as: CARDIZEM CD   gabapentin 100 MG capsule Commonly known as: NEURONTIN   metoprolol tartrate 25 MG tablet Commonly known as: LOPRESSOR   olmesartan 20 MG tablet Commonly known as: BENICAR   potassium gluconate 595 (99 K) MG Tabs tablet   psyllium 58.6 % powder Commonly known as: METAMUCIL   sulfamethoxazole-trimethoprim 800-160 MG tablet Commonly known as: BACTRIM DS       TAKE these medications    acetaminophen 325 MG tablet Commonly known as: TYLENOL Take  2 tablets (650 mg total) by mouth every 6 (six) hours as needed for mild pain (or Fever >/= 100.4).   amLODipine 5 MG tablet Commonly known as: NORVASC Take 5 mg by mouth daily.   apixaban 2.5 MG Tabs tablet Commonly  known as: ELIQUIS Take 1 tablet (2.5 mg total) by mouth 2 (two) times daily. What changed:  medication strength how much to take   ARTIFICIAL TEAR OP Apply to eye.   CoQ10 200 MG Caps Take by mouth daily.   gemfibrozil 600 MG tablet Commonly known as: LOPID Take 600 mg by mouth 2 (two) times daily.   ICAPS AREDS 2 PO Take 1 tablet by mouth daily.   metoprolol succinate 25 MG 24 hr tablet Commonly known as: TOPROL-XL Take 1 tablet by mouth daily. What changed: Another medication with the same name was removed. Continue taking this medication, and follow the directions you see here.   pantoprazole 40 MG tablet Commonly known as: PROTONIX Take 40 mg by mouth daily.   prochlorperazine 10 MG tablet Commonly known as: COMPAZINE Take 10 mg by mouth 2 (two) times daily as needed.   sucralfate 1 GM/10ML suspension Commonly known as: CARAFATE SMARTSIG:Milliliter(s) By Mouth   Vitamin D 50 MCG (2000 UT) Caps Take by mouth daily.        Subjective   Pt reports feeling much improvement. No more loose Bms since admission. He states that has some residual midline abdominal pain Objective  Blood pressure (!) 161/63, pulse (!) 58, temperature 97.7 F (36.5 C), temperature source Oral, resp. rate 18, height 6' (1.829 m), weight 88.2 kg, SpO2 97 %.   General: Pt is alert, awake, not in acute distress Cardiovascular: RRR, S1/S2 +, no rubs, no gallops Respiratory: CTA bilaterally, no wheezing, no rhonchi Abdominal: Soft, NT, ND, bowel sounds + Extremities: no edema, no cyanosis   The results of significant diagnostics from this hospitalization (including imaging, microbiology, ancillary and laboratory) are listed below for reference.   Imaging studies: CT Renal Stone Study  Result Date: 02/16/2022 CLINICAL DATA:  Flank pain, kidney stones suspected, rule out obstructive uropathy EXAM: CT ABDOMEN AND PELVIS WITHOUT CONTRAST TECHNIQUE: Multidetector CT imaging of the abdomen and  pelvis was performed following the standard protocol without IV contrast. RADIATION DOSE REDUCTION: This exam was performed according to the departmental dose-optimization program which includes automated exposure control, adjustment of the mA and/or kV according to patient size and/or use of iterative reconstruction technique. COMPARISON:  12/04/2018 FINDINGS: Lower chest: No acute abnormality. Hepatobiliary: No solid liver abnormality is seen. Large gallstones in the gallbladder. No gallbladder wall thickening, or biliary dilatation. Pancreas: Unremarkable. No pancreatic ductal dilatation or surrounding inflammatory changes. Spleen: Normal in size without significant abnormality. Adrenals/Urinary Tract: Adrenal glands are unremarkable. Kidneys are normal, without renal calculi, solid lesion, or hydronephrosis. Bladder is unremarkable. Stomach/Bowel: Stomach is within normal limits. Diverticulum of the descending duodenum. Appendix appears normal. No evidence of bowel wall thickening, distention, or inflammatory changes. Sigmoid diverticula. Vascular/Lymphatic: Aortic atherosclerosis. No enlarged abdominal or pelvic lymph nodes. Reproductive: No mass or other significant abnormality. Other: No abdominal wall hernia or abnormality. No ascites. Musculoskeletal: No acute or significant osseous findings. IMPRESSION: 1. No urinary tract calculus or hydronephrosis. 2. Cholelithiasis without evidence of acute cholecystitis. 3. 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 Aortic Atherosclerosis (ICD10-I70.0). Electronically Signed   By: Jearld LeschAlex D Bibbey M.D.   On: 02/16/2022 15:50    Labs: Basic Metabolic Panel: Recent Labs  Lab 02/16/22 1405  02/17/22 0510 02/17/22 1243  NA 135 136 137  K 5.6* 5.0 4.6  CL 105 109 107  CO2 23 22 24   GLUCOSE 85 90 114*  BUN 37* 29* 27*  CREATININE 1.69* 1.27* 1.39*  CALCIUM 9.0 8.5* 8.9   CBC: Recent Labs  Lab 02/16/22 1405  WBC 7.4  HGB 11.0*  HCT  34.5*  MCV 94.0  PLT 367   Microbiology: GI path panel pending  Time coordinating discharge: Over 30 minutes  02/18/22, MD  [facility] 02/17/2022, 1:31 PM

## 2022-02-17 NOTE — Discharge Instructions (Signed)
I'm glad that you are feeling better! I've made a few adjustments to your medications while in the hospital which are listed out in your paperwork and I'd like you to discuss these with your primary doctor within a week in a follow up appointment to discuss if they need to be restarted or modified once you have improved completely.  - I have stopped your olmesartan and potassium supplement because of your elevated potassium and kidney injury - I have decreased your apixaban to 2.5mg  daily (half your previous dose) which is recommended for your age and kidney function.   - please have your primary doctor check your electrolytes and kidney function at follow up appointment

## 2022-03-26 ENCOUNTER — Other Ambulatory Visit: Payer: Self-pay | Admitting: Student in an Organized Health Care Education/Training Program

## 2022-11-13 ENCOUNTER — Encounter: Payer: Self-pay | Admitting: Urology

## 2022-11-13 ENCOUNTER — Ambulatory Visit (INDEPENDENT_AMBULATORY_CARE_PROVIDER_SITE_OTHER): Payer: Medicare Other | Admitting: Urology

## 2022-11-13 VITALS — BP 119/79 | HR 121 | Ht 72.0 in | Wt 200.0 lb

## 2022-11-13 DIAGNOSIS — N5082 Scrotal pain: Secondary | ICD-10-CM

## 2022-11-13 LAB — URINALYSIS, COMPLETE
Bilirubin, UA: NEGATIVE
Glucose, UA: NEGATIVE
Ketones, UA: NEGATIVE
Leukocytes,UA: NEGATIVE
Nitrite, UA: NEGATIVE
Protein,UA: NEGATIVE
RBC, UA: NEGATIVE
Specific Gravity, UA: 1.015 (ref 1.005–1.030)
Urobilinogen, Ur: 0.2 mg/dL (ref 0.2–1.0)
pH, UA: 5.5 (ref 5.0–7.5)

## 2022-11-13 LAB — MICROSCOPIC EXAMINATION: RBC, Urine: NONE SEEN /hpf (ref 0–2)

## 2022-11-13 MED ORDER — ETODOLAC 400 MG PO TABS: ORAL_TABLET | ORAL | 0 refills | Status: AC

## 2022-11-13 NOTE — Progress Notes (Addendum)
11/13/2022 8:49 AM   Jeremy Mason 1941/04/08 [id number]  Referring provider: No referring provider defined for this encounter.  Chief Complaint  Patient presents with   Testicle Pain    HPI: Jeremy Mason is a 82 y.o. male self-referred for evaluation of scrotal pain.  1 month history of left hemiscrotal pain; located posterior aspect of the scrotum.  Pain nonradiating and worse when palpating the area Denies bothersome lower urinary tract symptoms, dysuria or gross hematuria Prior vasectomy 1974   PMH: Past Medical History:  Diagnosis Date   Aortic aneurysm (HCC)    Aortic stenosis    Arthritis    hips, legs   Atrial fibrillation (HCC)    Barrett's esophagus    Celiac disease    COPD (chronic obstructive pulmonary disease) (HCC)    GERD (gastroesophageal reflux disease)    Heart murmur    History of hiatal hernia    Hypertension    PONV (postoperative nausea and vomiting)    nausea after cataract   Sleep apnea    no CPAP   Vertigo    no episodes - 2-3 yrs    Surgical History: Past Surgical History:  Procedure Laterality Date   CARDIAC CATHETERIZATION     CARDIAC ELECTROPHYSIOLOGY STUDY AND ABLATION     CARDIOVERSION     CATARACT EXTRACTION W/PHACO Right 03/13/2016   Procedure: CATARACT EXTRACTION PHACO AND INTRAOCULAR LENS PLACEMENT ([place]);  Surgeon: Leandrew Koyanagi, MD;  Location: [place] [facility] [place];  Service: Ophthalmology;  Laterality: Right;  sleep apnea - no CPAP   CATARACT EXTRACTION W/PHACO Left 04/10/2016   Procedure: CATARACT EXTRACTION PHACO AND INTRAOCULAR LENS PLACEMENT ([place]) left eye;  Surgeon: Leandrew Koyanagi, MD;  Location: [place];  Service: Ophthalmology;  Laterality: Left;   COLONOSCOPY     TUMOR EXCISION     hip bone    Home Medications:  Allergies as of 11/13/2022       Reactions   Oxycodone Nausea And Vomiting   Procardia [nifedipine]    Olmesartan Other (See Comments)   Hyperkalemia  Hyperkalemia    Oxycodone-acetaminophen Nausea Only, Nausea And Vomiting   Oxycodone causes nausea/vomitng  Can take acetaminophen   Statins Other (See Comments)   Muscle pain   Tricor [fenofibrate]         Medication List        Accurate as of November 13, 2022  8:49 AM. If you have any questions, ask your nurse or doctor.          acetaminophen 325 MG tablet Commonly known as: TYLENOL Take 2 tablets (650 mg total) by mouth every 6 (six) hours as needed for mild pain (or Fever >/= 100.4).   amLODipine 5 MG tablet Commonly known as: NORVASC Take 5 mg by mouth daily.   apixaban 2.5 MG Tabs tablet Commonly known as: ELIQUIS Take 1 tablet (2.5 mg total) by mouth 2 (two) times daily.   ARTIFICIAL TEAR OP Apply to eye.   CoQ10 200 MG Caps Take by mouth daily.   gemfibrozil 600 MG tablet Commonly known as: LOPID Take 600 mg by mouth 2 (two) times daily.   ICAPS AREDS 2 PO Take 1 tablet by mouth daily.   metoprolol succinate 25 MG 24 hr tablet Commonly known as: TOPROL-XL Take 1 tablet by mouth daily.   pantoprazole 40 MG tablet Commonly known as: PROTONIX Take 40 mg by mouth daily.   prochlorperazine 10 MG tablet Commonly known as: COMPAZINE Take 10 mg by  mouth 2 (two) times daily as needed.   sucralfate 1 GM/10ML suspension Commonly known as: CARAFATE SMARTSIG:Milliliter(s) By Mouth   Vitamin D 50 MCG (2000 UT) Caps Take by mouth daily.        Allergies:  Allergies  Allergen Reactions   Oxycodone Nausea And Vomiting   Procardia [Nifedipine]    Olmesartan Other (See Comments)    Hyperkalemia   Hyperkalemia   Oxycodone-Acetaminophen Nausea Only and Nausea And Vomiting    Oxycodone causes nausea/vomitng  Can take acetaminophen   Statins Other (See Comments)    Muscle pain    Tricor [Fenofibrate]     Family History: History reviewed. No pertinent family history.  Social History:  reports that he quit smoking about 26 years ago. His smoking use included  cigarettes. He has never used smokeless tobacco. He reports that he does not drink alcohol. No history on file for drug use.   Physical Exam: BP 119/79   Pulse (!) 121   Ht 6' (1.829 m)   Wt 200 lb (90.7 kg)   BMI 27.12 kg/m   Constitutional:  Alert and oriented, No acute distress. HEENT: [place] AT, moist mucus membranes.  Trachea midline, no masses. Cardiovascular: No clubbing, cyanosis, or edema. Respiratory: Normal respiratory effort, no increased work of breathing. GI: Abdomen is soft, nontender, nondistended, no abdominal masses GU: Phallus without lesions.  Testes descended bilaterally without masses or tenderness.  Estimated volume 15 cc bilaterally.  ~ 1 cm soft mass superior to the left testis in the region of the globus major.  Mild tenderness Skin: No rashes, bruises or suspicious lesions. Neurologic: Grossly intact, no focal deficits, moving all 4 extremities. Psychiatric: Normal mood and affect.   Assessment & Plan:    1.  Scrotal pain Probable inflammatory epididymitis He is on Eliquis but states he is able to take short NSAID courses up to 5 days. Etodolac 400 mg daily x 5 days.  May take a p.m. dose as needed for pain Scrotal ultrasound ordered and will call with results   Abbie Sons, [place] [address] [place], [place] [address] [phone]

## 2022-11-14 ENCOUNTER — Ambulatory Visit
Admission: RE | Admit: 2022-11-14 | Discharge: 2022-11-14 | Disposition: A | Discharge status: Home / Self Care | Payer: Medicare Other | Source: Ambulatory Visit | Attending: Urology | Admitting: Urology

## 2022-11-14 DIAGNOSIS — N5082 Scrotal pain: Secondary | ICD-10-CM | POA: Diagnosis not present

## 2022-11-19 ENCOUNTER — Telehealth: Payer: Self-pay | Admitting: Family Medicine

## 2022-11-19 NOTE — Telephone Encounter (Signed)
-----   Message from Abbie Sons, MD sent at 11/15/2022  9:52 AM EST ----- Scrotal ultrasound showed no significant abnormalities

## 2022-11-19 NOTE — Telephone Encounter (Signed)
Patient notified and voiced understanding.

## 2023-03-21 ENCOUNTER — Other Ambulatory Visit
Admission: RE | Admit: 2023-03-21 | Discharge: 2023-03-21 | Disposition: A | Discharge status: Home / Self Care | Payer: Medicare Other | Source: Ambulatory Visit | Attending: Internal Medicine | Admitting: Internal Medicine

## 2023-03-21 DIAGNOSIS — R252 Cramp and spasm: Secondary | ICD-10-CM | POA: Diagnosis present

## 2023-03-21 DIAGNOSIS — R0789 Other chest pain: Secondary | ICD-10-CM | POA: Diagnosis present

## 2023-03-21 LAB — D-DIMER, QUANTITATIVE: D-Dimer, Quant: 0.68 ug/mL-FEU — ABNORMAL HIGH (ref 0.00–0.50)

## 2023-05-15 ENCOUNTER — Other Ambulatory Visit: Payer: Self-pay | Admitting: Physician Assistant

## 2023-05-15 DIAGNOSIS — R519 Headache, unspecified: Secondary | ICD-10-CM

## 2023-05-15 DIAGNOSIS — R42 Dizziness and giddiness: Secondary | ICD-10-CM

## 2023-05-20 ENCOUNTER — Ambulatory Visit
Admission: RE | Admit: 2023-05-20 | Discharge: 2023-05-20 | Disposition: A | Discharge status: Home / Self Care | Payer: Medicare Other | Source: Ambulatory Visit | Attending: Physician Assistant | Admitting: Physician Assistant

## 2023-05-20 DIAGNOSIS — R42 Dizziness and giddiness: Secondary | ICD-10-CM | POA: Diagnosis present

## 2023-05-20 DIAGNOSIS — R519 Headache, unspecified: Secondary | ICD-10-CM | POA: Diagnosis present

## 2023-12-03 ENCOUNTER — Other Ambulatory Visit: Payer: Self-pay | Admitting: Physician Assistant

## 2023-12-03 DIAGNOSIS — R4189 Other symptoms and signs involving cognitive functions and awareness: Secondary | ICD-10-CM

## 2023-12-03 DIAGNOSIS — Z8673 Personal history of transient ischemic attack (TIA), and cerebral infarction without residual deficits: Secondary | ICD-10-CM

## 2023-12-08 ENCOUNTER — Ambulatory Visit
Admission: RE | Admit: 2023-12-08 | Discharge: 2023-12-08 | Disposition: A | Discharge status: Home / Self Care | Source: Ambulatory Visit | Attending: Physician Assistant | Admitting: Physician Assistant

## 2023-12-08 DIAGNOSIS — R4189 Other symptoms and signs involving cognitive functions and awareness: Secondary | ICD-10-CM

## 2023-12-08 DIAGNOSIS — Z8673 Personal history of transient ischemic attack (TIA), and cerebral infarction without residual deficits: Secondary | ICD-10-CM

## 2024-01-22 IMAGING — CT CT RENAL STONE PROTOCOL
2 of 4 series · 16 of 46 positions shown, 18 images · non-contrast
Comparison: 12/04/2018

CLINICAL DATA: Flank pain, kidney stones suspected, rule out
obstructive uropathy



[Series 2: stone full standard · axial · 0.90mm/px · z∈[-1142,-672]mm · 13 of 104 slices shown, 15 images]
[im 5/104  soft-tissue]
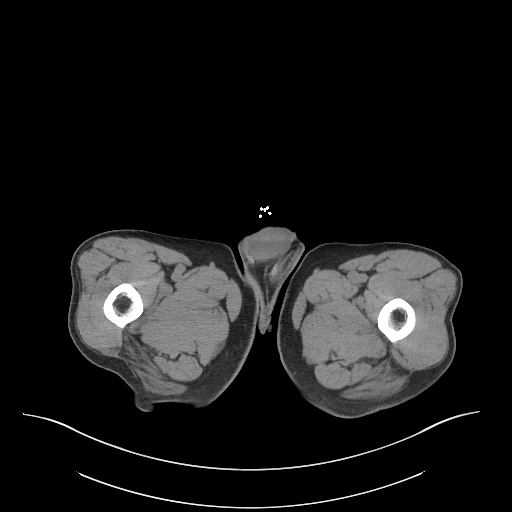
[im 5/104  bone]
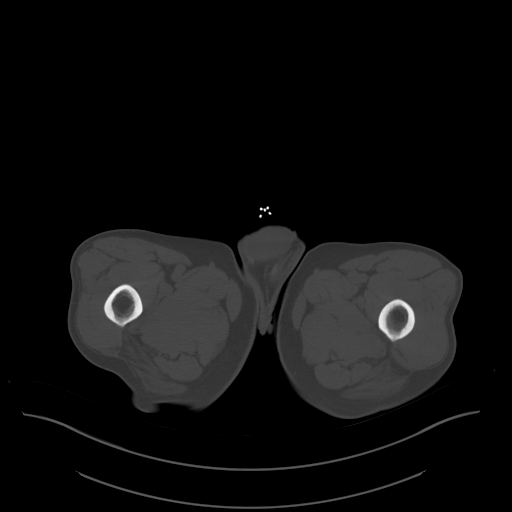
[im 14/104  soft-tissue]
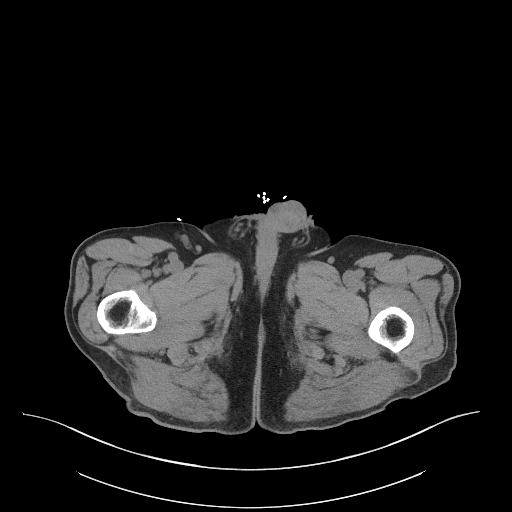
[im 23/104  soft-tissue]
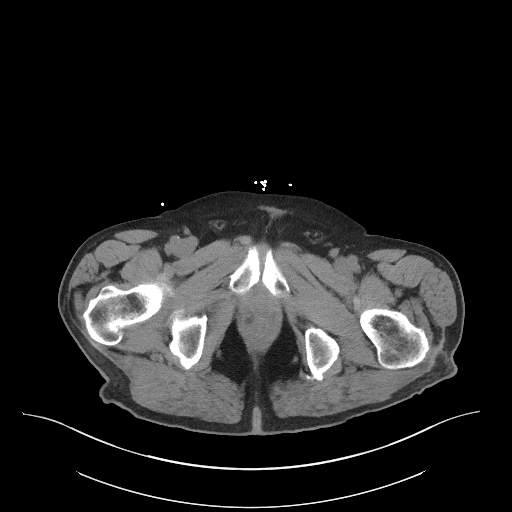
[im 27/104  soft-tissue]
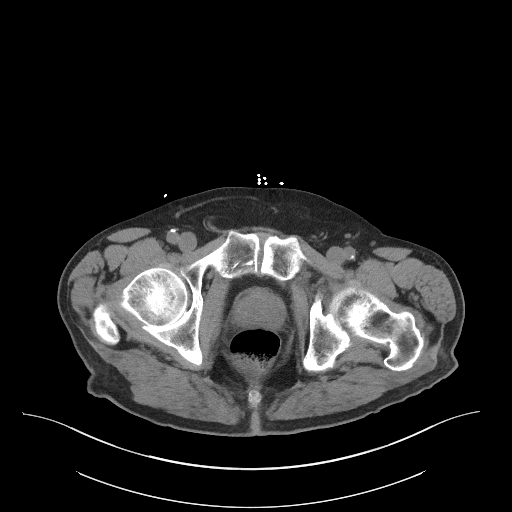
[im 36/104  soft-tissue]
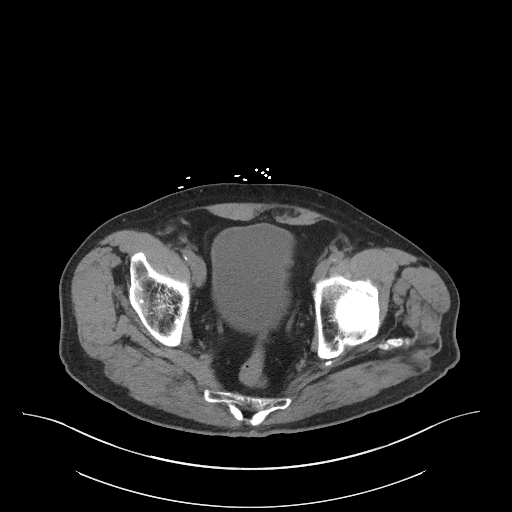
[im 45/104  soft-tissue]
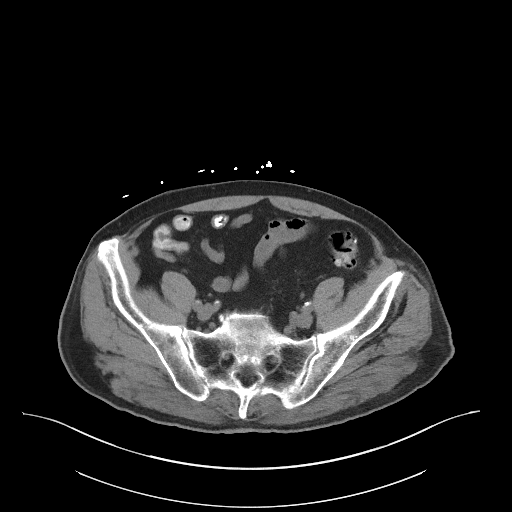
[im 54/104  soft-tissue]
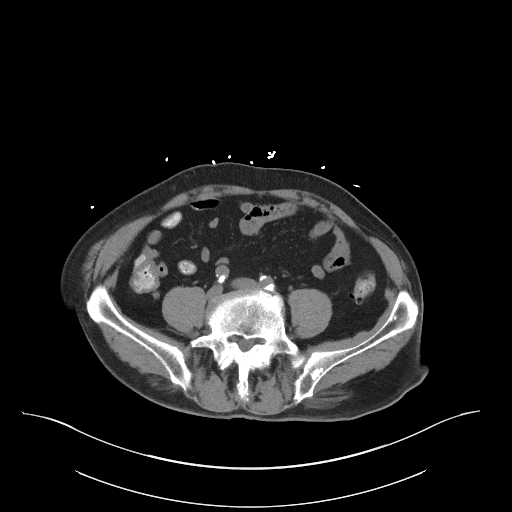
[im 59/104  soft-tissue]
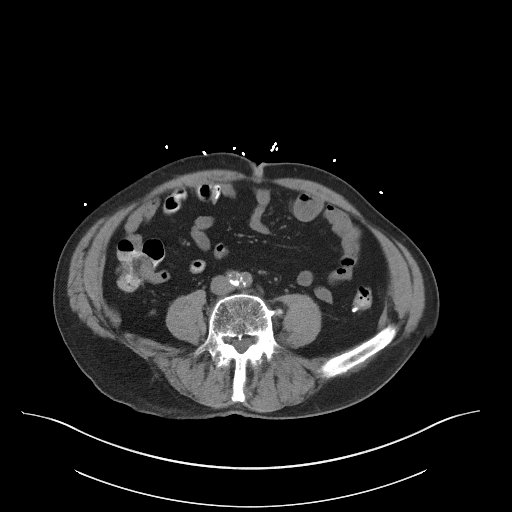
[im 68/104  soft-tissue]
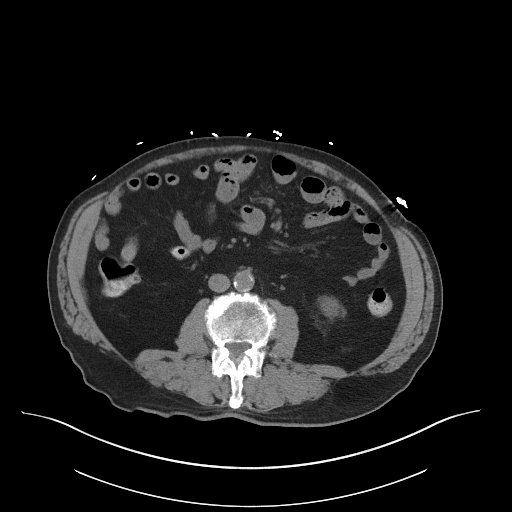
[im 68/104  bone]
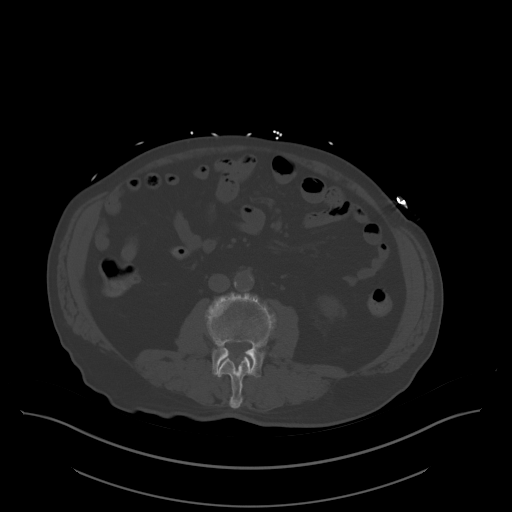
[im 77/104  soft-tissue]
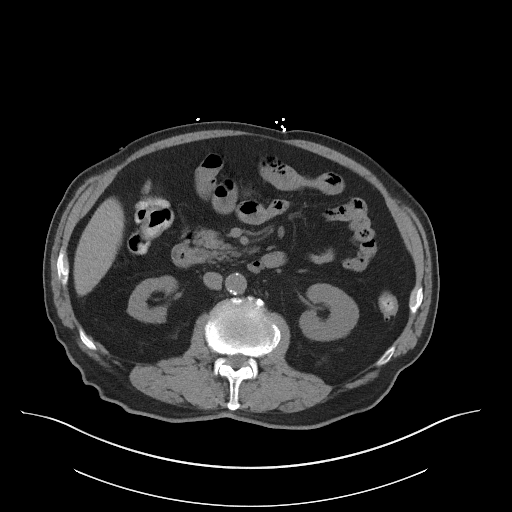
[im 81/104  soft-tissue]
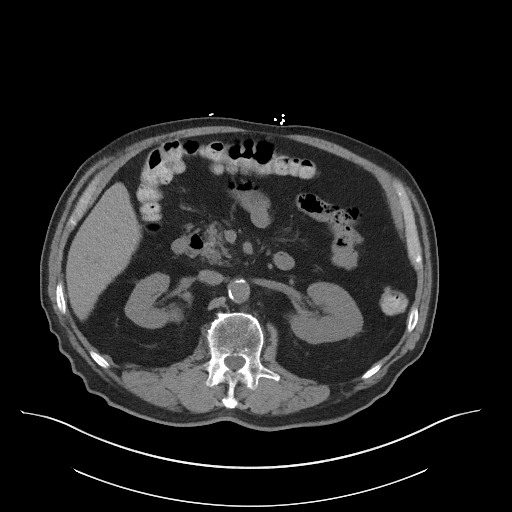
[im 90/104  soft-tissue]
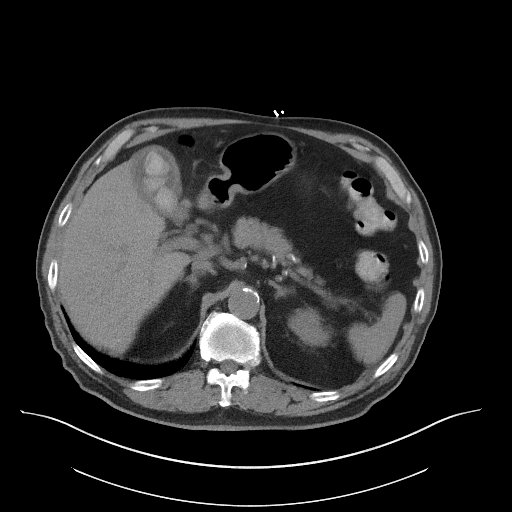
[im 99/104  soft-tissue]
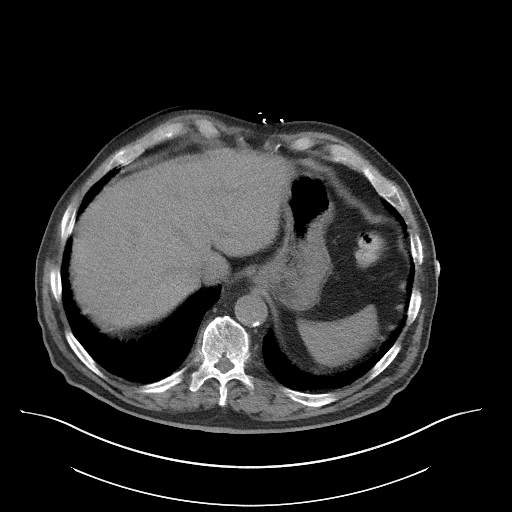

[Series 5: coronal · coronal · 0.83mm/px · 3 of 152 slices shown]
[im 51/152  soft-tissue]
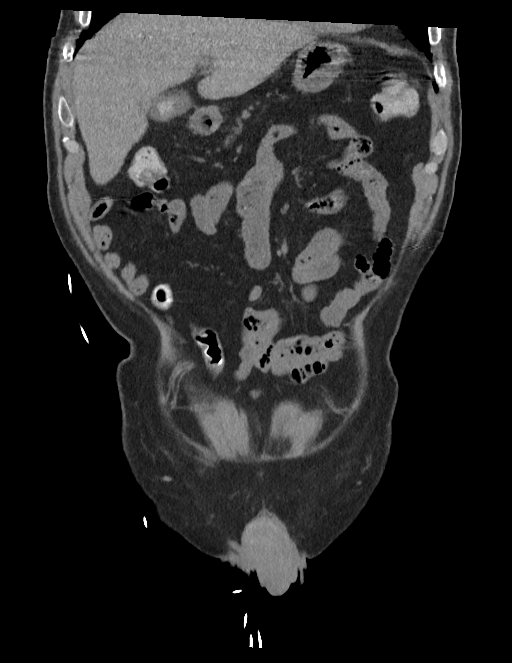
[im 68/152  soft-tissue]
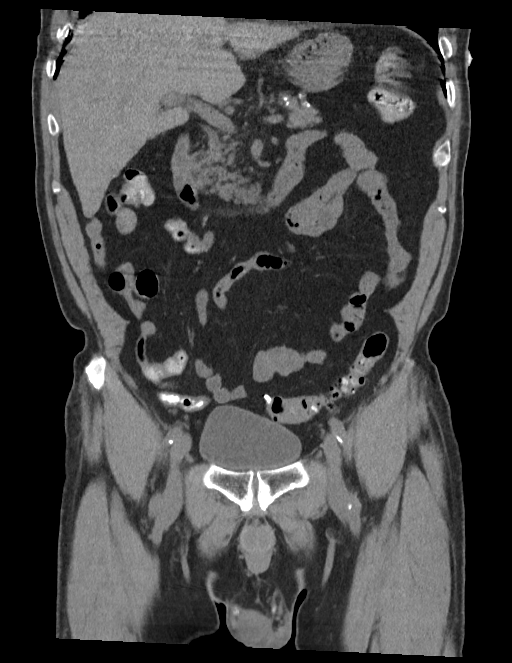
[im 84/152  soft-tissue]
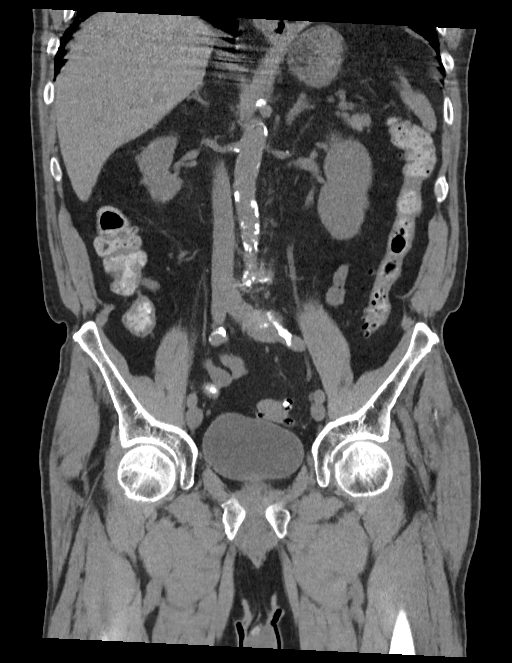

[16 of 46 positions shown; findings below may reference images not displayed]

FINDINGS: Lower chest: No acute abnormality.

Hepatobiliary: No solid liver abnormality is seen. Large gallstones
in the gallbladder. No gallbladder wall thickening, or biliary
dilatation.

Pancreas: Unremarkable. No pancreatic ductal dilatation or
surrounding inflammatory changes.

Spleen: Normal in size without significant abnormality.

Adrenals/Urinary Tract: Adrenal glands are unremarkable. Kidneys are
normal, without renal calculi, solid lesion, or hydronephrosis.
Bladder is unremarkable.

Stomach/Bowel: Stomach is within normal limits. Diverticulum of the
descending duodenum. Appendix appears normal. No evidence of bowel
wall thickening, distention, or inflammatory changes. Sigmoid
diverticula.

Vascular/Lymphatic: Aortic atherosclerosis. No enlarged abdominal or
pelvic lymph nodes.

Reproductive: No mass or other significant abnormality.

Other: No abdominal wall hernia or abnormality. No ascites.

Musculoskeletal: No acute or significant osseous findings.
IMPRESSION: 1. No urinary tract calculus or hydronephrosis.
2. Cholelithiasis without evidence of acute cholecystitis.
3. Sigmoid diverticulosis without evidence of acute diverticulitis.

Aortic Atherosclerosis ([id number]-M70.0).
